# Patient Record
Sex: Male | Born: 1990 | Race: White | Hispanic: No | Marital: Married | Smoking: Former smoker
Health system: Southern US, Community
[De-identification: ages and names within clinical notes are randomized; demographics above are authoritative.]

## PROBLEM LIST (undated history)

## (undated) DIAGNOSIS — N200 Calculus of kidney: Secondary | ICD-10-CM

## (undated) HISTORY — PX: NO PAST SURGERIES: SHX2092

---
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
## 2014-09-23 ENCOUNTER — Emergency Department: Payer: Self-pay

## 2014-09-23 ENCOUNTER — Encounter: Payer: Self-pay | Admitting: *Deleted

## 2014-09-23 ENCOUNTER — Emergency Department
Admission: EM | Admit: 2014-09-23 | Discharge: 2014-09-23 | Disposition: A | Discharge status: Home / Self Care | Payer: Self-pay | Attending: Emergency Medicine | Admitting: Emergency Medicine

## 2014-09-23 DIAGNOSIS — M545 Low back pain: Secondary | ICD-10-CM | POA: Insufficient documentation

## 2014-09-23 DIAGNOSIS — Z720 Tobacco use: Secondary | ICD-10-CM | POA: Insufficient documentation

## 2014-09-23 DIAGNOSIS — R1031 Right lower quadrant pain: Secondary | ICD-10-CM | POA: Insufficient documentation

## 2014-09-23 LAB — URINALYSIS COMPLETE WITH MICROSCOPIC (ARMC ONLY)
Bacteria, UA: NONE SEEN
Bilirubin Urine: NEGATIVE
GLUCOSE, UA: NEGATIVE mg/dL
KETONES UR: NEGATIVE mg/dL
NITRITE: NEGATIVE
Protein, ur: NEGATIVE mg/dL
SPECIFIC GRAVITY, URINE: 1.019 (ref 1.005–1.030)
Squamous Epithelial / LPF: NONE SEEN
pH: 5 (ref 5.0–8.0)

## 2014-09-23 MED ORDER — IBUPROFEN 800 MG PO TABS: 800.0000 mg | ORAL_TABLET | Freq: Three times a day (TID) | ORAL | Status: DC | PRN

## 2014-09-23 MED ORDER — OXYCODONE-ACETAMINOPHEN 5-325 MG PO TABS
2.0000 | ORAL_TABLET | Freq: Once | ORAL | Status: AC
  Administered 2014-09-23: 2 via ORAL

## 2014-09-23 MED ORDER — CIPROFLOXACIN HCL 250 MG PO TABS: 250.0000 mg | ORAL_TABLET | Freq: Two times a day (BID) | ORAL | Status: AC

## 2014-09-23 MED ORDER — OXYCODONE-ACETAMINOPHEN 5-325 MG PO TABS
ORAL_TABLET | ORAL | Status: AC
  Filled 2014-09-23: qty 1

## 2014-09-23 NOTE — Discharge Instructions (Signed)

## 2014-09-23 NOTE — ED Notes (Signed)
Pt states after he urinated it did relieve some of his pain.

## 2014-09-23 NOTE — ED Notes (Signed)
Pt states he started to have lower right back pain this morning. Now the pain goes down through his groin/right testicle. He denies swelling. Denies urinary symptoms or penile discharge

## 2014-09-23 NOTE — ED Provider Notes (Signed)
[facility] Emergency Department Provider Note     Time seen: ----------------------------------------- 7:40 PM on 09/23/2014 -----------------------------------------    I have reviewed the triage vital signs and the nursing notes.   HISTORY  Chief Complaint Groin Pain    HPI Charles Dunlap is a 24 y.o. male presents ER for right lower back pain and right groin testicular pain. He denies any swelling, denies any urinary symptoms or discharge. Patient states he does light lifting on his job, was complaining of just mild pain.   History reviewed. No pertinent past medical history.  There are no active problems to display for this patient.   History reviewed. No pertinent past surgical history.  Allergies Review of patient's allergies indicates no known allergies.  Social History History  Substance Use Topics  . Smoking status: Current Every Day Smoker  . Smokeless tobacco: Not on file  . Alcohol Use: No    Review of Systems Constitutional: Negative for fever. Eyes: Negative for visual changes. ENT: Negative for sore throat. Cardiovascular: Negative for chest pain. Respiratory: Negative for shortness of breath. Gastrointestinal: Positive for right-sided groin pain. Genitourinary: Negative for dysuria. Musculoskeletal: Positive for right-sided lower back pain Skin: Negative for rash. Neurological: Negative for headaches, focal weakness or numbness.  10-point ROS otherwise negative.  ____________________________________________   PHYSICAL EXAM:  VITAL SIGNS: ED Triage Vitals  Enc Vitals Group     BP 09/23/14 1843 112/66 mmHg     Pulse Rate 09/23/14 1843 95     Resp 09/23/14 1843 16     Temp 09/23/14 1843 98.2 F (36.8 C)     Temp Source 09/23/14 1843 Oral     SpO2 09/23/14 1843 97 %     Weight 09/23/14 1843 139 lb (63.05 kg)     Height 09/23/14 1843  (1.575 m)     Head Cir --      Peak Flow --      Pain Score  09/23/14 1843 6     Pain Loc --      Pain Edu? --      Excl. in GC? --     Constitutional: Alert and oriented. Well appearing and in no distress. Eyes: Conjunctivae are normal. PERRL. Normal extraocular movements. ENT   Head: Normocephalic and atraumatic.   Nose: No congestion/rhinnorhea.   Mouth/Throat: Mucous membranes are moist.   Neck: No stridor. Cardiovascular: Normal rate, regular rhythm. Normal and symmetric distal pulses are present in all extremities. No murmurs, rubs, or gallops. Respiratory: Normal respiratory effort without tachypnea nor retractions. Breath sounds are clear and equal bilaterally. No wheezes/rales/rhonchi. Gastrointestinal: Soft and nontender. No distention. No abdominal bruits. No CVA tenderness Genitourinary: Patient has small inguinal bulge with straining or coughing system with mild hernia. Testicles are nontender. Scrotum and penile exam is unremarkable. Musculoskeletal: Nontender with normal range of motion in all extremities. No joint effusions.  No lower extremity tenderness nor edema. Neurologic:  Normal speech and language. No gross focal neurologic deficits are appreciated. Speech is normal. No gait instability. Skin:  Skin is warm, dry and intact. No rash noted. Psychiatric: Mood and affect are normal. Speech and behavior are normal. Patient exhibits appropriate insight and judgment. ____________________________________________  ED COURSE:  Pertinent labs & imaging results that were available during my care of the patient were reviewed by me and considered in my medical decision making (see chart for details). Patient looks well, minimal complaints currently. We'll look at his urine as well as get a  KUB ____________________________________________    LABS (pertinent positives/negatives)  Labs Reviewed  URINALYSIS COMPLETEWITH MICROSCOPIC ([facility] ONLY) - Abnormal; Notable for the following:    Color, Urine YELLOW (*)    APPearance  HAZY (*)    Hgb urine dipstick 2+ (*)    Leukocytes, UA TRACE (*)    All other components within normal limits    RADIOLOGY Images were viewed by me  KUB FINDINGS: Normal bowel gas pattern. No calcified urinary tract calculi are visible. Normal appearing bones.  IMPRESSION: Normal examination. ____________________________________________  FINAL ASSESSMENT AND PLAN  Groin pain, inguinal hernia  Plan: Patient with labs and imaging as dictated above. KUB is unremarkable. Unclear etiology for the urine appearance. We'll put him on a few days of Cipro and have him return in 2 days for recheck.   Emily Filbert, MD   Emily Filbert, MD 09/23/14 [phone]

## 2015-10-24 ENCOUNTER — Encounter: Payer: Self-pay | Admitting: Emergency Medicine

## 2015-10-24 ENCOUNTER — Emergency Department
Admission: EM | Admit: 2015-10-24 | Discharge: 2015-10-24 | Disposition: A | Discharge status: Home / Self Care | Payer: BC Managed Care – PPO | Attending: Emergency Medicine | Admitting: Emergency Medicine

## 2015-10-24 ENCOUNTER — Emergency Department: Payer: BC Managed Care – PPO

## 2015-10-24 DIAGNOSIS — N200 Calculus of kidney: Secondary | ICD-10-CM | POA: Insufficient documentation

## 2015-10-24 DIAGNOSIS — R1032 Left lower quadrant pain: Secondary | ICD-10-CM | POA: Diagnosis present

## 2015-10-24 DIAGNOSIS — R109 Unspecified abdominal pain: Secondary | ICD-10-CM

## 2015-10-24 DIAGNOSIS — F17200 Nicotine dependence, unspecified, uncomplicated: Secondary | ICD-10-CM | POA: Insufficient documentation

## 2015-10-24 LAB — COMPREHENSIVE METABOLIC PANEL
ALBUMIN: 4.2 g/dL (ref 3.5–5.0)
ALK PHOS: 98 U/L (ref 38–126)
ALT: 35 U/L (ref 17–63)
ANION GAP: 6 (ref 5–15)
AST: 31 U/L (ref 15–41)
BUN: 18 mg/dL (ref 6–20)
CALCIUM: 9.1 mg/dL (ref 8.9–10.3)
CO2: 27 mmol/L (ref 22–32)
Chloride: 106 mmol/L (ref 101–111)
Creatinine, Ser: 1.04 mg/dL (ref 0.61–1.24)
GFR calc Af Amer: >60 mL/min (ref >60)
GFR calc non Af Amer: >60 mL/min (ref >60)
GLUCOSE: 109 mg/dL — AB (ref 65–99)
Potassium: 4.1 mmol/L (ref 3.5–5.1)
SODIUM: 139 mmol/L (ref 135–145)
Total Bilirubin: 0.7 mg/dL (ref 0.3–1.2)
Total Protein: 7.6 g/dL (ref 6.5–8.1)

## 2015-10-24 LAB — CBC WITH DIFFERENTIAL/PLATELET
BASOS ABS: 0.1 10*3/uL (ref 0–0.1)
Eosinophils Absolute: 0.2 10*3/uL (ref 0–0.7)
Eosinophils Relative: 2 %
HCT: 43.2 % (ref 40.0–52.0)
HEMOGLOBIN: 15.5 g/dL (ref 13.0–18.0)
Lymphocytes Relative: 53 %
Lymphs Abs: 4.8 10*3/uL — ABNORMAL HIGH (ref 1.0–3.6)
MCH: 32.7 pg (ref 26.0–34.0)
MCHC: 35.9 g/dL (ref 32.0–36.0)
MCV: 91 fL (ref 80.0–100.0)
Monocytes Absolute: 0.7 10*3/uL (ref 0.2–1.0)
Neutro Abs: 3.3 10*3/uL (ref 1.4–6.5)
Platelets: 180 10*3/uL (ref 150–440)
RBC: 4.75 MIL/uL (ref 4.40–5.90)
RDW: 12.7 % (ref 11.5–14.5)
WBC: 9.2 10*3/uL (ref 3.8–10.6)

## 2015-10-24 LAB — URINALYSIS COMPLETE WITH MICROSCOPIC (ARMC ONLY)
Bacteria, UA: NONE SEEN
Bilirubin Urine: NEGATIVE
Glucose, UA: NEGATIVE mg/dL
Ketones, ur: NEGATIVE mg/dL
Leukocytes, UA: NEGATIVE
NITRITE: NEGATIVE
Protein, ur: 30 mg/dL — AB
SPECIFIC GRAVITY, URINE: 1.016 (ref 1.005–1.030)
pH: 6 (ref 5.0–8.0)

## 2015-10-24 LAB — LIPASE, BLOOD: Lipase: 30 U/L (ref 11–51)

## 2015-10-24 MED ORDER — TAMSULOSIN HCL 0.4 MG PO CAPS: 0.4000 mg | ORAL_CAPSULE | Freq: Every day | ORAL | 0 refills | Status: DC

## 2015-10-24 MED ORDER — ONDANSETRON HCL 4 MG/2ML IJ SOLN
4.0000 mg | Freq: Once | INTRAMUSCULAR | Status: AC
  Administered 2015-10-24: 4 mg via INTRAVENOUS
  Filled 2015-10-24: qty 2

## 2015-10-24 MED ORDER — MORPHINE SULFATE (PF) 4 MG/ML IV SOLN
4.0000 mg | Freq: Once | INTRAVENOUS | Status: AC
  Administered 2015-10-24: 4 mg via INTRAVENOUS
  Filled 2015-10-24: qty 1

## 2015-10-24 MED ORDER — ONDANSETRON HCL 4 MG PO TABS: 4.0000 mg | ORAL_TABLET | Freq: Three times a day (TID) | ORAL | 0 refills | Status: DC | PRN

## 2015-10-24 MED ORDER — OXYCODONE-ACETAMINOPHEN 5-325 MG PO TABS: 1.0000 | ORAL_TABLET | Freq: Four times a day (QID) | ORAL | 0 refills | Status: AC | PRN

## 2015-10-24 MED ORDER — SODIUM CHLORIDE 0.9 % IV BOLUS (SEPSIS)
1000.0000 mL | Freq: Once | INTRAVENOUS | Status: AC
  Administered 2015-10-24: 1000 mL via INTRAVENOUS

## 2015-10-24 NOTE — Discharge Instructions (Signed)
Please seek medical attention for any high fevers, chest pain, shortness of breath, change in behavior, persistent vomiting, bloody stool or any other new or concerning symptoms.  

## 2015-10-24 NOTE — ED Provider Notes (Signed)
[facility] Emergency Department Provider Note   ____________________________________________   I have reviewed the triage vital signs and the nursing notes.   HISTORY  Chief Complaint Abdominal Pain; Nausea; and Emesis   History limited by: Not Limited   HPI Charles Dunlap is a 25 y.o. male who presents to the emergency department today because concerns for left lower quadrant pain. The pain started this morning. It is severe. It is a cramping type pain. It has been associated with some nausea and vomiting. The pain will be somewhat relieved after the vomiting. The patient has not had any diarrhea this morning. He has not noticed any recent change in his bowel movements. No change in his stooling. The patient denies any fevers. No sick contacts. No unusual ingestion.   History reviewed. No pertinent past medical history.  There are no active problems to display for this patient.   History reviewed. No pertinent surgical history.  Prior to Admission medications   Medication Sig Start Date End Date Taking? Authorizing Provider  ibuprofen (ADVIL,MOTRIN) 800 MG tablet Take 1 tablet (800 mg total) by mouth every 8 (eight) hours as needed. 09/23/14   Emily FilbertJonathan E Williams, MD    Allergies Review of patient's allergies indicates no known allergies.  No family history on file.  Social History Social History  Substance Use Topics  . Smoking status: Current Every Day Smoker  . Smokeless tobacco: Not on file  . Alcohol use No    Review of Systems  Constitutional: Negative for fever. Cardiovascular: Negative for chest pain. Respiratory: Negative for shortness of breath. Gastrointestinal: Positive for left lower quadrant pain. Genitourinary: Negative for dysuria. Neurological: Negative for headaches, focal weakness or numbness.  10-point ROS otherwise negative.  ____________________________________________   PHYSICAL EXAM:  VITAL  SIGNS: ED Triage Vitals  Enc Vitals Group     BP 10/24/15 0835 125/64     Pulse Rate 10/24/15 0835 (!) 54     Resp 10/24/15 0835 18     Temp --      Temp Source 10/24/15 0835 Oral     SpO2 10/24/15 0835 100 %     Weight 10/24/15 0836 140 lb (63.5 kg)     Height 10/24/15 0836 5\' 10"  (1.778 m)     Head Circumference --      Peak Flow --      Pain Score 10/24/15 0836 8   Constitutional: Alert and oriented. Appears uncomfortable. Eyes: Conjunctivae are normal. Normal extraocular movements. ENT   Head: Normocephalic and atraumatic.   Nose: No congestion/rhinnorhea.   Mouth/Throat: Mucous membranes are moist.   Neck: No stridor. Hematological/Lymphatic/Immunilogical: No cervical lymphadenopathy. Cardiovascular: Normal rate, regular rhythm.  No murmurs, rubs, or gallops. Respiratory: Normal respiratory effort without tachypnea nor retractions. Breath sounds are clear and equal bilaterally. No wheezes/rales/rhonchi. Gastrointestinal: Soft and tender to palpation in the left lower quadrant. No rebound. No guarding. Genitourinary: Deferred Musculoskeletal: Normal range of motion in all extremities. No lower extremity edema. Neurologic:  Normal speech and language. No gross focal neurologic deficits are appreciated.  Skin:  Skin is warm, dry and intact. No rash noted. Psychiatric: Mood and affect are normal. Speech and behavior are normal. Patient exhibits appropriate insight and judgment.  ____________________________________________    LABS (pertinent positives/negatives)  Labs Reviewed  CBC WITH DIFFERENTIAL/PLATELET - Abnormal; Notable for the following:       Result Value   Lymphs Abs 4.8 (*)    All other components within normal limits  COMPREHENSIVE METABOLIC PANEL - Abnormal; Notable for the following:    Glucose, Bld 109 (*)    All other components within normal limits  URINALYSIS COMPLETEWITH MICROSCOPIC ([facility] ONLY) - Abnormal; Notable for the following:     Color, Urine YELLOW (*)    APPearance CLEAR (*)    Hgb urine dipstick 3+ (*)    Protein, ur 30 (*)    Squamous Epithelial / LPF 0-5 (*)    All other components within normal limits  LIPASE, BLOOD  '   ____________________________________________   EKG  None  ____________________________________________    RADIOLOGY  CT stone   IMPRESSION:  1-2 mm diameter calculus at LEFT ureterovesical junction without  hydronephrosis or hydroureter.      ___________________________________________   PROCEDURES  Procedures  ____________________________________________   INITIAL IMPRESSION / ASSESSMENT AND PLAN / ED COURSE  Pertinent labs & imaging results that were available during my care of the patient were reviewed by me and considered in my medical decision making (see chart for details).  Patient presents to the emergency department today because of concerns for left lower quadrant cramping, pain nausea and vomiting. On exam patient does appear uncomfortable and does have some mild tenderness in the left lower quadrant. Will check blood work and urine.  Clinical Course   Urine did show multiple red blood cells. CT scan did confirm a left UVJ stone. 1-2 mm. This should be able to pass on its own. Discussed this with the patient. Discussed infection return precautions. ____________________________________________   FINAL CLINICAL IMPRESSION(S) / ED DIAGNOSES  Final diagnoses:  Left flank pain  Kidney stone     Note: This dictation was prepared with Dragon dictation. Any transcriptional errors that result from this process are unintentional    Phineas Semen, MD 10/24/15 1324

## 2015-10-24 NOTE — ED Triage Notes (Signed)
Pt presents with LLQ abdominal pain with nausea and vomiting that started today.

## 2017-08-17 ENCOUNTER — Emergency Department
Admission: EM | Admit: 2017-08-17 | Discharge: 2017-08-17 | Disposition: A | Discharge status: Home / Self Care | Payer: BC Managed Care – PPO | Attending: Emergency Medicine | Admitting: Emergency Medicine

## 2017-08-17 ENCOUNTER — Encounter: Payer: Self-pay | Admitting: Emergency Medicine

## 2017-08-17 ENCOUNTER — Other Ambulatory Visit: Payer: Self-pay

## 2017-08-17 DIAGNOSIS — F17200 Nicotine dependence, unspecified, uncomplicated: Secondary | ICD-10-CM | POA: Insufficient documentation

## 2017-08-17 DIAGNOSIS — R070 Pain in throat: Secondary | ICD-10-CM | POA: Diagnosis present

## 2017-08-17 DIAGNOSIS — J029 Acute pharyngitis, unspecified: Secondary | ICD-10-CM | POA: Insufficient documentation

## 2017-08-17 DIAGNOSIS — Z79899 Other long term (current) drug therapy: Secondary | ICD-10-CM | POA: Insufficient documentation

## 2017-08-17 LAB — GROUP A STREP BY PCR: GROUP A STREP BY PCR: NOT DETECTED

## 2017-08-17 MED ORDER — ONDANSETRON 4 MG PO TBDP: 4.0000 mg | ORAL_TABLET | Freq: Three times a day (TID) | ORAL | 0 refills | Status: DC | PRN

## 2017-08-17 MED ORDER — KETOROLAC TROMETHAMINE 30 MG/ML IJ SOLN
15.0000 mg | INTRAMUSCULAR | Status: AC
  Administered 2017-08-17: 15 mg via INTRAMUSCULAR
  Filled 2017-08-17: qty 1

## 2017-08-17 MED ORDER — IBUPROFEN 200 MG PO TABS: 600.0000 mg | ORAL_TABLET | Freq: Four times a day (QID) | ORAL | 0 refills | Status: DC | PRN

## 2017-08-17 MED ORDER — DEXAMETHASONE SODIUM PHOSPHATE 10 MG/ML IJ SOLN
10.0000 mg | Freq: Once | INTRAMUSCULAR | Status: AC
  Administered 2017-08-17: 10 mg via INTRAMUSCULAR
  Filled 2017-08-17: qty 1

## 2017-08-17 NOTE — Discharge Instructions (Addendum)
Your strep test was negative. Drink lots of water to stay hydrated.  Hot water or herbal tea with honey can help soothe your throat.

## 2017-08-17 NOTE — ED Triage Notes (Addendum)
Patient ambulatory to triage with steady gait, without difficulty, voice muffled; Sore throat with fever yesterday; awoke PTA and felt increased pain/swelling to left side throat

## 2017-08-17 NOTE — ED Provider Notes (Signed)
[facility] Emergency Department Provider Note  ____________________________________________  Time seen: Approximately 5:13 AM  I have reviewed the triage vital signs and the nursing notes.   HISTORY  Chief Complaint Sore Throat    HPI Charles Dunlap is a 27 y.o. male with no past medical history who complains of sore throat starting 1 day ago.  His daughter recently was sick with hand-foot-and-mouth disease.  He had a subjective fever, sore throat, and then tonight he woke up with increased pain and swelling on the left side of the throat.  It hurts to swallow.  He is not having difficulty with his saliva.  No vomiting.  Denies chest pain shortness of breath or abdominal pain.  He does have some mild nausea as well.  Pain is constant, worse with swallowing no alleviating factors.  Moderate intensity.  Burning sensation.    History reviewed. No pertinent past medical history.   There are no active problems to display for this patient.    History reviewed. No pertinent surgical history.   Prior to Admission medications   Medication Sig Start Date End Date Taking? Authorizing Provider  ibuprofen (MOTRIN IB) 200 MG tablet Take 3 tablets (600 mg total) by mouth every 6 (six) hours as needed. 08/17/17   Sharman CheekStafford, Jonluke Cobbins, MD  ondansetron (ZOFRAN ODT) 4 MG disintegrating tablet Take 1 tablet (4 mg total) by mouth every 8 (eight) hours as needed for nausea or vomiting. 08/17/17   Sharman CheekStafford, Sesilia Poucher, MD  ondansetron (ZOFRAN) 4 MG tablet Take 1 tablet (4 mg total) by mouth every 8 (eight) hours as needed for nausea or vomiting. 10/24/15   Phineas SemenGoodman, Graydon, MD  tamsulosin (FLOMAX) 0.4 MG CAPS capsule Take 1 capsule (0.4 mg total) by mouth daily. 10/24/15   Phineas SemenGoodman, Graydon, MD     Allergies Patient has no known allergies.   No family history on file.  Social History Social History   Tobacco Use  . Smoking status: Current Every Day Smoker  Substance Use  Topics  . Alcohol use: No  . Drug use: No    Review of Systems  Constitutional:   Positive fever at home ENT:   Positive sore throat. No rhinorrhea. Cardiovascular:   No chest pain or syncope. Respiratory:   No dyspnea or cough. Gastrointestinal:   Negative for abdominal pain, vomiting and diarrhea.  Musculoskeletal:   Negative for focal pain or swelling All other systems reviewed and are negative except as documented above in ROS and HPI.  ____________________________________________   PHYSICAL EXAM:  VITAL SIGNS: ED Triage Vitals [08/17/17 0359]  Enc Vitals Group     BP 126/66     Pulse Rate 88     Resp 20     Temp 99 F (37.2 C)     Temp Source Oral     SpO2      Weight 160 lb (72.6 kg)     Height 5\' 10"  (1.778 m)     Head Circumference      Peak Flow      Pain Score 6     Pain Loc      Pain Edu?      Excl. in GC?     Vital signs reviewed, nursing assessments reviewed.   Constitutional:   Alert and oriented. Non-toxic appearance. Eyes:   Conjunctivae are normal. EOMI. PERRL. ENT      Head:   Normocephalic and atraumatic.      Nose:   No congestion/rhinnorhea.  Mouth/Throat:   MMM, bilateral pharyngeal erythema without exudate. No peritonsillar mass.       Neck:   No meningismus. Full ROM. Hematological/Lymphatic/Immunilogical:   Left submandibular lymphadenopathy. Cardiovascular:   RRR. Symmetric bilateral radial and DP pulses.  No murmurs.  Respiratory:   Normal respiratory effort without tachypnea/retractions. Breath sounds are clear and equal bilaterally. No wheezes/rales/rhonchi. Gastrointestinal:   Soft and nontender. Non distended. There is no CVA tenderness.  No rebound, rigidity, or guarding.  Musculoskeletal:   Normal range of motion in all extremities. No joint effusions.  No lower extremity tenderness.  No edema. Neurologic:   Normal speech and language.  Motor grossly intact. No acute focal neurologic deficits are appreciated.  Skin:     Skin is warm, dry and intact. No rash noted.  No petechiae, purpura, or bullae.  ____________________________________________    LABS (pertinent positives/negatives) (all labs ordered are listed, but only abnormal results are displayed) Labs Reviewed  GROUP A STREP BY PCR   ____________________________________________   EKG    ____________________________________________    RADIOLOGY  No results found.  ____________________________________________   PROCEDURES Procedures  ____________________________________________    CLINICAL IMPRESSION / ASSESSMENT AND PLAN / ED COURSE  Pertinent labs & imaging results that were available during my care of the patient were reviewed by me and considered in my medical decision making (see chart for details).    Patient presents with pharyngitis and tonsillitis.  Strep test is negative.  Most likely due to coxsackievirus.  I highly doubt PTA or RPA.  No airway compromise.  Managing secretions.  Treat symptomatically with Decadron and Toradol here, continue NSAIDs and Zofran as needed at home.  Follow-up with primary care..      ____________________________________________   FINAL CLINICAL IMPRESSION(S) / ED DIAGNOSES    Final diagnoses:  Viral pharyngitis     ED Discharge Orders        Ordered    ibuprofen (MOTRIN IB) 200 MG tablet  Every 6 hours PRN     08/17/17 0511    ondansetron (ZOFRAN ODT) 4 MG disintegrating tablet  Every 8 hours PRN     08/17/17 0511      Portions of this note were generated with dragon dictation software. Dictation errors may occur despite best attempts at proofreading.    Sharman Cheek, MD 08/17/17 [phone]

## 2018-04-26 ENCOUNTER — Other Ambulatory Visit: Payer: Self-pay

## 2018-04-26 ENCOUNTER — Emergency Department
Admission: EM | Admit: 2018-04-26 | Discharge: 2018-04-26 | Disposition: A | Discharge status: Home / Self Care | Payer: Worker's Compensation | Attending: Student in an Organized Health Care Education/Training Program | Admitting: Student in an Organized Health Care Education/Training Program

## 2018-04-26 DIAGNOSIS — F17200 Nicotine dependence, unspecified, uncomplicated: Secondary | ICD-10-CM | POA: Diagnosis not present

## 2018-04-26 DIAGNOSIS — Y9289 Other specified places as the place of occurrence of the external cause: Secondary | ICD-10-CM | POA: Insufficient documentation

## 2018-04-26 DIAGNOSIS — S61411A Laceration without foreign body of right hand, initial encounter: Secondary | ICD-10-CM | POA: Diagnosis not present

## 2018-04-26 DIAGNOSIS — Y9389 Activity, other specified: Secondary | ICD-10-CM | POA: Diagnosis not present

## 2018-04-26 DIAGNOSIS — S6991XA Unspecified injury of right wrist, hand and finger(s), initial encounter: Secondary | ICD-10-CM | POA: Diagnosis present

## 2018-04-26 DIAGNOSIS — X58XXXA Exposure to other specified factors, initial encounter: Secondary | ICD-10-CM | POA: Diagnosis not present

## 2018-04-26 DIAGNOSIS — Y990 Civilian activity done for income or pay: Secondary | ICD-10-CM | POA: Diagnosis not present

## 2018-04-26 MED ORDER — LIDOCAINE HCL (PF) 1 % IJ SOLN
5.0000 mL | Freq: Once | INTRAMUSCULAR | Status: AC
  Administered 2018-04-26: 5 mL
  Filled 2018-04-26: qty 5

## 2018-04-26 NOTE — ED Triage Notes (Signed)
Pt has laceration to rt hand from work.

## 2018-04-26 NOTE — ED Provider Notes (Signed)
[facility] Emergency Department Provider Note ____________________________________________  Time seen: 1600  I have reviewed the triage vital signs and the nursing notes.  HISTORY  Chief Complaint  Laceration  HPI Charles Dunlap is a 28 y.o. Right-handed male presents himself to the ED for evaluation of an accidental laceration to the right hand.  Patient works at a [employer], and describes repairing a tire.  He was placing air in the tire the large tire exploded in his hand.  It caused a hyper extension injury of his thumb at the first interspace, leaving the patient with a large "laceration" to the base of the thumb.  He presents now with an open wound exposing the flexor tendon.  He denies any other injury at this time.  He reports his tetanus status is current up to date.  No past medical history on file.  There are no active problems to display for this patient.  No past surgical history on file.  Prior to Admission medications   Not on File    Allergies Patient has no known allergies.  No family history on file.  Social History Social History   Tobacco Use  . Smoking status: Current Every Day Smoker  Substance Use Topics  . Alcohol use: No  . Drug use: No    Review of Systems  Constitutional: Negative for fever. Cardiovascular: Negative for chest pain. Respiratory: Negative for shortness of breath. Musculoskeletal: Negative for back pain. Skin: Negative for rash. Hand laceration as above Neurological: Negative for headaches, focal weakness or numbness. ____________________________________________  PHYSICAL EXAM:  VITAL SIGNS: ED Triage Vitals  Enc Vitals Group     BP 04/26/18 1552 122/70     Pulse Rate 04/26/18 1552 72     Resp 04/26/18 1552 16     Temp 04/26/18 1552 (!) 97.5 F (36.4 C)     Temp Source 04/26/18 1552 Oral     SpO2 04/26/18 1552 99 %     Weight 04/26/18 1549 158 lb 11.7 oz (72 kg)   Height --      Head Circumference --      Peak Flow --      Pain Score 04/26/18 1549 8     Pain Loc --      Pain Edu? --      Excl. in GC? --     Constitutional: Alert and oriented. Well appearing and in no distress. Head: Normocephalic and atraumatic. Eyes: Conjunctivae are normal. Normal extraocular movements Cardiovascular: Normal rate, regular rhythm. Normal distal pulses. Respiratory: Normal respiratory effort.  Musculoskeletal: Right hand without any obvious deformity or dislocation.  Patient able to demonstrate normal composite fist on the right.  Normal thumb flexion range noted.  Patient with a laceration extending along the palmar base of the right thumb.  Nontender with normal range of motion in all extremities.  Neurologic:  Normal gross sensation. Normal intrinsic and opposition testing noted. No gross focal neurologic deficits are appreciated. Skin:  Skin is warm, dry and intact. No rash noted. ____________________________________________  PROCEDURES  .Marland KitchenLaceration Repair Date/Time: 04/26/2018 5:40 PM Performed by: Lissa Hoard, PA-C Authorized by: Lissa Hoard, PA-C   Consent:    Consent given by:  Patient   Risks discussed:  Pain, infection and poor wound healing Anesthesia (see MAR for exact dosages):    Anesthesia method:  Local infiltration   Local anesthetic:  Lidocaine 1% w/o epi Laceration details:    Location:  Hand  Length (cm):  4   Depth (mm):  4 Repair type:    Repair type:  Simple Pre-procedure details:    Preparation:  Patient was prepped and draped in usual sterile fashion Exploration:    Contaminated: no   Treatment:    Area cleansed with:  Betadine and saline   Amount of cleaning:  Standard   Irrigation solution:  Sterile saline   Irrigation method:  Syringe   Visualized foreign bodies/material removed: no   Skin repair:    Repair method:  Sutures   Suture size:  4-0   Suture material:  Nylon   Suture technique:   Simple interrupted   Number of sutures:  9 Approximation:    Approximation:  Close Post-procedure details:    Dressing:  Non-adherent dressing and bulky dressing   Patient tolerance of procedure:  Tolerated well, no immediate complications  ____________________________________________  INITIAL IMPRESSION / ASSESSMENT AND PLAN / ED COURSE  Patient with ED evaluation of an accidental laceration to the right hand.  The palmar wound is repaired using sutures with good wound edge approximation.  The patient is discharged with wound care instructions and supplies.  He will follow-up with [place] urgent care for suture removal and interim wound check in 12 to 14 days.  He is return to work with right hand use as tolerated, with instructions to keep the wound/dressing clean and dry. ____________________________________________  FINAL CLINICAL IMPRESSION(S) / ED DIAGNOSES  Final diagnoses:  Laceration of right hand without foreign body, initial encounter      Lissa Hoard, PA-C 04/26/18 2322    Willy Eddy, MD 04/26/18 2351

## 2018-04-26 NOTE — Discharge Instructions (Signed)
Keep the wound clean, dry, and covered. Use only soap & water to cleanse the wound. Follow-up with [facility] for suture removal in 12 days, or interim wound checks.

## 2018-04-26 NOTE — ED Notes (Signed)
See triage note.  States he was working with a truck tire and it exploded   Laceration to right hand   Was seen at urgent care and sent here d/t depth of laceration

## 2018-11-20 ENCOUNTER — Other Ambulatory Visit: Payer: Self-pay

## 2018-11-20 ENCOUNTER — Ambulatory Visit
Admission: EM | Admit: 2018-11-20 | Discharge: 2018-11-20 | Disposition: A | Discharge status: Home / Self Care | Payer: BC Managed Care – PPO | Attending: Emergency Medicine | Admitting: Emergency Medicine

## 2018-11-20 ENCOUNTER — Ambulatory Visit (INDEPENDENT_AMBULATORY_CARE_PROVIDER_SITE_OTHER)
Admit: 2018-11-20 | Discharge: 2018-11-20 | Disposition: A | Discharge status: Home / Self Care | Payer: BC Managed Care – PPO | Attending: Emergency Medicine | Admitting: Emergency Medicine

## 2018-11-20 ENCOUNTER — Ambulatory Visit (INDEPENDENT_AMBULATORY_CARE_PROVIDER_SITE_OTHER): Payer: BC Managed Care – PPO

## 2018-11-20 DIAGNOSIS — L03115 Cellulitis of right lower limb: Secondary | ICD-10-CM

## 2018-11-20 DIAGNOSIS — M25561 Pain in right knee: Secondary | ICD-10-CM

## 2018-11-20 MED ORDER — DOXYCYCLINE HYCLATE 100 MG PO CAPS: 100.0000 mg | ORAL_CAPSULE | Freq: Two times a day (BID) | ORAL | 0 refills | Status: DC

## 2018-11-20 MED ORDER — CEFTRIAXONE SODIUM 1 G IJ SOLR
1.0000 g | Freq: Once | INTRAMUSCULAR | Status: AC
  Administered 2018-11-20: 17:00:00 1 g via INTRAMUSCULAR

## 2018-11-20 NOTE — ED Triage Notes (Signed)
Patient states that 1.5 week ago he started noticing an abscess on his right knee. Patient states that the area opened and drained and had improved. Patient states that he noticed this past Friday his knee was sore again, he has redness and swelling from knee down to his ankle. Warm to the touch.

## 2018-11-20 NOTE — Discharge Instructions (Signed)
Limit your standing and sitting.  Elevate your leg above the level of your heart sufficiently to control swelling and discomfort.  Apply warm compresses or heating pad to the lower leg 3 times daily for 10 to 15 minutes at a time.  Do not sleep with a heating pad.  Take medications as prescribed.  If you run a fever or are not improving go to the emergency room or return to our clinic.  Return to our clinic in 2 days for a recheck.

## 2018-11-20 NOTE — ED Provider Notes (Signed)
[facility]    CSN: [id number] Arrival date & time: 11/20/18  1403      History   Chief Complaint Chief Complaint  Patient presents with   Abscess    HPI Charles Dunlap is a 28 y.o. male.   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  He has states that the area opened spontaneously and drained purulent material and he assisted by gently squeezing to express as much as he could.  Afterwards his knee improved and he had no further problems.  He has had no fever or chills.  3 days prior to this visit his knee became sore again.  He has redness and swelling distal to the original site of drainage.  He states that his leg  was so swollen this morning he was unable to fit his jeans on.  He has taken car trip to [place] last week entailing a 2-1/2-hour drive there and back.  He states now the initial area of drainage is not tender or painful at all. It has not been draining.  Also his problem is the swelling of his leg and the tenderness and warmth that he is experiencing of his lower extremity.  Vital signs today show a temperature of 98.5 pulse rate of 104 blood pressure 137/79.       History reviewed. No pertinent past medical history.  There are no active problems to display for this patient.   Past Surgical History:  Procedure Laterality Date   NO PAST SURGERIES         Home Medications    Prior to Admission medications   Medication Sig Start Date End Date Taking? Authorizing Provider  doxycycline (VIBRAMYCIN) 100 MG capsule Take 1 capsule (100 mg total) by mouth 2 (two) times daily. 11/20/18   Lorin Picket, PA-C    Family History Family History  Problem Relation Age of Onset   Heart attack Mother    Diabetes Father     Social History Social History   Tobacco Use   Smoking status: Former Smoker    Quit date: 11/19/2016      Years since quitting: 2.0   Smokeless tobacco: Never Used  Substance Use Topics   Alcohol use: Yes    Comment: rare   Drug use: No     Allergies   Patient has no known allergies.   Review of Systems Review of Systems  Constitutional: Positive for activity change. Negative for appetite change, chills, diaphoresis, fatigue and fever.  Musculoskeletal: Positive for myalgias.  Skin: Positive for color change and wound.  All other systems reviewed and are negative.    Physical Exam Triage Vital Signs ED Triage Vitals  Enc Vitals Group     BP 11/20/18 1423 137/79     Pulse Rate 11/20/18 1423 (!) 104     Resp 11/20/18 1423 18     Temp 11/20/18 1423 98.5 F (36.9 C)     Temp Source 11/20/18 1423 Oral     SpO2 11/20/18 1423 100 %     Weight 11/20/18 1421 160 lb (72.6 kg)     Height 11/20/18 1421 6' (1.829 m)     Head Circumference --      Peak Flow --      Pain Score 11/20/18 1421 4     Pain Loc --      Pain  Edu? --      Excl. in GC? --    No data found.  Updated Vital Signs BP 117/78 (BP Location: Left Arm)    Pulse 76    Temp 98.2 F (36.8 C) (Oral)    Resp 16    Ht 6' (1.829 m)    Wt 160 lb (72.6 kg)    SpO2 100%    BMI 21.70 kg/m   Visual Acuity Right Eye Distance:   Left Eye Distance:   Bilateral Distance:    Right Eye Near:   Left Eye Near:    Bilateral Near:     Physical Exam Vitals signs and nursing note reviewed.  Constitutional:      General: He is not in acute distress.    Appearance: Normal appearance. He is normal weight. He is not ill-appearing, toxic-appearing or diaphoretic.  HENT:     Head: Normocephalic and atraumatic.  Eyes:     Conjunctiva/sclera: Conjunctivae normal.     Pupils: Pupils are equal, round, and reactive to light.  Neck:     Musculoskeletal: Normal range of motion and neck supple.  Cardiovascular:     Rate and Rhythm: Normal rate and regular rhythm.     Pulses: Normal pulses.     Heart sounds: Normal heart sounds.   Pulmonary:     Effort: Pulmonary effort is normal.     Breath sounds: Normal breath sounds.  Musculoskeletal: Normal range of motion.        General: Swelling and tenderness present.     Right lower leg: Edema present.     Comments: Examination of the right lower extremity shows swelling from the superior patella region inferiorly to the ankle.  The area is warm.  It is tender and circumferentially about the lower extremity.  Circumference of the right lower extremity is 38.5 cm in comparison to the left which is 35.5 cm.  Pulses are palpable.  Motion shows full extension and flexion to 100 degrees limited by swelling in the knee.  Does not appear to be any effusion present.  He has no tenderness about the knee specifically the patella or tibia.  He has a healing wound with's no fluctuation or induration over the antero-medial aspect of the proximal tibia.  There is no drainage present.  Skin:    General: Skin is warm and dry.     Findings: Erythema present.  Neurological:     General: No focal deficit present.     Mental Status: He is alert and oriented to person, place, and time.  Psychiatric:        Mood and Affect: Mood normal.        Behavior: Behavior normal.        Thought Content: Thought content normal.        Judgment: Judgment normal.      UC Treatments / Results  Labs (all labs ordered are listed, but only abnormal results are displayed) Labs Reviewed - No data to display  EKG   Radiology US Venous Img Lower Unilateral Right  Result Date: 11/20/2018 CLINICAL DATA:  Right lower extremity swelling and tenderness EXAM: RIGHT LOWER EXTREMITY VENOUS DOPPLER ULTRASOUND TECHNIQUE: Gray-scale sonography with graded compression, as well as color Doppler and duplex ultrasound were performed to evaluate the lower extremity deep venous systems from the level of the common femoral vein and including the common femoral, femoral, profunda femoral, popliteal and calf veins including  the posterior tibial, peroneal and gastrocnemius veins when visible.  The superficial great saphenous vein was also interrogated. Spectral Doppler was utilized to evaluate flow at rest and with distal augmentation maneuvers in the common femoral, femoral and popliteal veins. COMPARISON:  None. FINDINGS: Contralateral Common Femoral Vein: Respiratory phasicity is normal and symmetric with the symptomatic side. No evidence of thrombus. Normal compressibility. Common Femoral Vein: No evidence of thrombus. Normal compressibility, respiratory phasicity and response to augmentation. Saphenofemoral Junction: No evidence of thrombus. Normal compressibility and flow on color Doppler imaging. Profunda Femoral Vein: No evidence of thrombus. Normal compressibility and flow on color Doppler imaging. Femoral Vein: No evidence of thrombus. Normal compressibility, respiratory phasicity and response to augmentation. Popliteal Vein: No evidence of thrombus. Normal compressibility, respiratory phasicity and response to augmentation. Calf Veins: No evidence of thrombus. Normal compressibility and flow on color Doppler imaging. Superficial Great Saphenous Vein: No evidence of thrombus. Normal compressibility. Venous Reflux:  Not assessed Other Findings: Infrapatellar soft tissue swelling noted anteriorly. No underlying fluid collection. IMPRESSION: Negative for DVT in the right lower extremity. Right infrapatellar region soft tissue swelling. Electronically Signed   By: Judie Petit.  Shick M.D.   On: 11/20/2018 17:02   Dg Knee Complete 4 Views Right  Result Date: 11/20/2018 CLINICAL DATA:  Rule out abscess or bone infection. Soreness over the apex of the patella. EXAM: RIGHT KNEE - COMPLETE 4+ VIEW COMPARISON:  None. FINDINGS: The patient has a bipartite patella. No acute fracture or effusion. Anterior soft tissue swelling is seen, particularly anterior to the tibial tuberosity. No soft tissue gas or foreign body identified. No acute fractures  or dislocations. No other acute abnormalities. IMPRESSION: Anterior soft tissue swelling, particularly anterior to the tibial tuberosity. No soft tissue gas or foreign body identified. No bony erosion to suggest osteomyelitis. Bipartite patella. Electronically Signed   By: Gerome Sam III M.D   On: 11/20/2018 17:17    Procedures Procedures (including critical care time)  Medications Ordered in UC Medications  cefTRIAXone (ROCEPHIN) injection 1 g (1 g Intramuscular Given 11/20/18 1700)    Initial Impression / Assessment and Plan / UC Course  I have reviewed the triage vital signs and the nursing notes.  Pertinent labs & imaging results that were available during my care of the patient were reviewed by me and considered in my medical decision making (see chart for details).   28 year old male presents with a week and a half history of an abscess that drained from his right knee over the antero-proximal medial aspect.  He says after the drainage did seem to improve until this last Friday 3 days prior to this visit when his knee became sore swollen and warm.  Area of original drainage is not tender or draining any longer but the inferior portion of his lower extremity is erythematous warm tender and swollen.  Ultrasound was obtained because the possibility of a DVT which was found to be negative.  An x-ray was also obtained afterwards which revealed anterior soft tissue swelling anterior to the tibial tuberosity.  There is no soft tissue gas or foreign body identified.  There is no bony erosion to suggest osteomyelitis.  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  He was found to have a cellulitis of the lower extremity.  He has no fever or chills.  Does not complain of other systemic symptoms.  He received 1 g of Rocephin intramuscularly today.  I will place him on doxycycline 100 mg twice daily for the next 7 days.  I told him it is imperative that  he return to our clinic in 2 days to make sure that he  cellulitis is improving.  It is not he will likely require IV antibiotic therapy.  He will remain out of work for the next 3 days and limit his standing and sitting.  He was encouraged to elevate his leg above the level of his heart sufficiently control the swelling and pain.  He may also wrap his leg in warm compress or heating pad 3 times daily for a 10 to 15 minutes at a time Final Clinical Impressions(s) / UC Diagnoses   Final diagnoses:  Cellulitis of right lower extremity     Discharge Instructions     Limit your standing and sitting.  Elevate your leg above the level of your heart sufficiently to control swelling and discomfort.  Apply warm compresses or heating pad to the lower leg 3 times daily for 10 to 15 minutes at a time.  Do not sleep with a heating pad.  Take medications as prescribed.  If you run a fever or are not improving go to the emergency room or return to our clinic.  Return to our clinic in 2 days for a recheck.    ED Prescriptions    Medication Sig Dispense Auth. Provider   doxycycline (VIBRAMYCIN) 100 MG capsule Take 1 capsule (100 mg total) by mouth 2 (two) times daily. 14 capsule Lutricia Feiloemer, Annise Boran P, PA-C     PDMP not reviewed this encounter.   Lutricia FeilRoemer, Jerrel Tiberio P, PA-C 11/20/18 1745

## 2018-11-22 ENCOUNTER — Other Ambulatory Visit: Payer: Self-pay

## 2018-11-22 ENCOUNTER — Encounter: Payer: Self-pay | Admitting: Emergency Medicine

## 2018-11-22 ENCOUNTER — Ambulatory Visit
Admission: EM | Admit: 2018-11-22 | Discharge: 2018-11-22 | Disposition: A | Discharge status: Home / Self Care | Payer: BC Managed Care – PPO

## 2018-11-22 DIAGNOSIS — Z5189 Encounter for other specified aftercare: Secondary | ICD-10-CM

## 2018-11-22 DIAGNOSIS — L03115 Cellulitis of right lower limb: Secondary | ICD-10-CM

## 2018-11-22 NOTE — ED Triage Notes (Signed)
Pt states that he was told to follow up of cellulitis. He states it is much better and he can walk on it. Tolerating treatment well.

## 2018-11-22 NOTE — Discharge Instructions (Signed)
It was very nice seeing you today in clinic. Thank you for entrusting me with your care.   Continue antibiotics as prescribed. Ice and elevate knee to help with any swelling. May take Tylenol and/or ibuprofen as needed for pain if you have any. May return to work.   Make arrangements to follow up with your regular doctor in 1 week for re-evaluation if not improving. If your symptoms/condition worsens, please seek follow up care either here or in the ER. Please remember, our [place] providers are "right here with you" when you need [place].   Again, it was my pleasure to take care of you today. Thank you for choosing our clinic. I hope that you start to feel better quickly.   Honor Loh, MSN, APRN, FNP-C, CEN Advanced Practice Provider [facility]

## 2018-11-22 NOTE — ED Provider Notes (Signed)
[place], [place]   Name: Charles Dunlap DOB: 05-06-1990 MRN: [id number] CSN: [id number] PCP: System, Pcp Not In  Arrival date and time:  11/22/18 1102  Chief Complaint:  Cellulitis follow up  NOTE: Prior to seeing the patient today, I have reviewed the triage nursing documentation and vital signs. Clinical staff has updated patient's PMH/PSHx, current medication list, and drug allergies/intolerances to ensure comprehensive history available to assist in medical decision making.   History:   HPI: Charles Dunlap is a 28 y.o. male who presents today for a reassessment of a cellulitis that was diagnosed in his RIGHT lower extremity on 11/20/2018. Patient underwent ultrasound imaging that was negative for DVT. He was given IM ceftriaxone in clinic and was discharged home on a 7 day course of oral doxycycline. Patient was advised to return to clinic in 2 days for reassessment. Patient presents today with significant improvement in his symptoms. He states, "it looks and feels 10 times better that it did 2 days ago when I was here". Patient denies any drainage from the area. Erythema has completely resolved. Patient denies any pain in his calf; no claudication pain. Patient has not experienced any fevers. He has been following the recommendations made by previous provider; rest and elevation. Patient presents today feeling generally well. He states, "I am ready to go back to work I think".   History reviewed. No pertinent past medical history.  Past Surgical History:  Procedure Laterality Date   NO PAST SURGERIES      Family History  Problem Relation Age of Onset   Heart attack Mother    Diabetes Father     Social History   Tobacco Use   Smoking status: Former Smoker    Quit date: 11/19/2016    Years since quitting: 2.0   Smokeless tobacco: Never Used  Substance Use Topics   Alcohol use: Yes    Comment: rare   Drug use: No    There are no active problems to display  for this patient.   Home Medications:    Current Meds  Medication Sig   doxycycline (VIBRAMYCIN) 100 MG capsule Take 1 capsule (100 mg total) by mouth 2 (two) times daily.    Allergies:   Patient has no known allergies.  Review of Systems (ROS): Review of Systems   Vital Signs: Today's Vitals   11/22/18 1125 11/22/18 1127 11/22/18 1154  BP:  119/70   Pulse:  67   Resp:  16   Temp:  98.2 F (36.8 C)   TempSrc:  Oral   SpO2:  100%   Weight: 160 lb 0.9 oz (72.6 kg)    PainSc: 1   1     Physical Exam: Physical Exam  Urgent Care Treatments / Results:   LABS: PLEASE NOTE: all labs that were ordered this encounter are listed, however only abnormal results are displayed. Labs Reviewed - No data to display  EKG: -None  RADIOLOGY: No results found.  PROCEDURES: Procedures  MEDICATIONS RECEIVED THIS VISIT: Medications - No data to display  PERTINENT CLINICAL COURSE NOTES/UPDATES:   Initial Impression / Assessment and Plan / Urgent Care Course:  Pertinent labs & imaging results that were available during my care of the patient were personally reviewed by me and considered in my medical decision making (see lab/imaging section of note for values and interpretations).  Charles Dunlap is a 28 y.o. male who presents to [facility] today for follow up on recent cellulitis that he had diagnosed  in his RIGHT lower extremity on 11/20/2018.  Patient is well appearing overall in clinic today. He does not appear to be in any acute distress. Presenting symptoms (see HPI) and exam as documented above. Symptoms have significant improved with the IM and oral antibiotics thus far. Erythema and swelling have resolved. Patient notes pain rated 1/10 with ambulation only. He feels generally well; denies fever and drainage. Patient to continue oral doxycycline course. He was advised to also continue rest, ice, and elevation of his RLE and to return call to the clinic with any  questions or concerns. Patient may return to work at this point.   Discussed follow up with primary care physician in 1 week for re-evaluation. I have reviewed the follow up and strict return precautions for any new or worsening symptoms. Patient is aware of symptoms that would be deemed urgent/emergent, and would thus require further evaluation either here or in the emergency department. At the time of discharge, he verbalized understanding and consent with the discharge plan as it was reviewed with him. All questions were fielded by provider and/or clinic staff prior to patient discharge.    Final Clinical Impressions / Urgent Care Diagnoses:   Final diagnoses:  Encounter for wound re-check  Cellulitis of right lower extremity    New Prescriptions:  [place] Controlled Substance Registry consulted? Not Applicable  No orders of the defined types were placed in this encounter.   Recommended Follow up Care:  Patient encouraged to follow up with the following provider within the specified time frame, or sooner as dictated by the severity of his symptoms. As always, he was instructed that for any urgent/emergent care needs, he should seek care either here or in the emergency department for more immediate evaluation.  Follow-up Information    PCP In 1 week.   Why: General reassessment of symptoms if not improving        NOTE: This note was prepared using Lobby[employer] along with smaller Company secretary. Despite my best ability to proofread, there is the potential that transcriptional errors may still occur from this process, and are completely unintentional.    Karen Kitchens, NP 11/22/18 1411

## 2019-07-29 ENCOUNTER — Encounter: Payer: Self-pay | Admitting: Emergency Medicine

## 2019-07-29 ENCOUNTER — Ambulatory Visit
Admission: EM | Admit: 2019-07-29 | Discharge: 2019-07-29 | Disposition: A | Discharge status: Home / Self Care | Payer: BC Managed Care – PPO | Attending: Family Medicine | Admitting: Family Medicine

## 2019-07-29 ENCOUNTER — Other Ambulatory Visit: Payer: Self-pay

## 2019-07-29 DIAGNOSIS — J0100 Acute maxillary sinusitis, unspecified: Secondary | ICD-10-CM | POA: Diagnosis not present

## 2019-07-29 MED ORDER — AMOXICILLIN 875 MG PO TABS: 875.0000 mg | ORAL_TABLET | Freq: Two times a day (BID) | ORAL | 0 refills | Status: DC

## 2019-07-29 NOTE — ED Provider Notes (Signed)
[facility]    CSN: [id number] Arrival date & time: 07/29/19  1026      History   Chief Complaint Chief Complaint  Patient presents with  . Sinus Problem    HPI Dyron Kawano is a 29 y.o. male.   29 yo male with a c/o sinus congestion, sinus pressure/headache, tooth/jaw discomfort for the past week. States he has seasonal allergies and every year around this time, he develops a sinus infection. Denies any fevers, chills, chest pains, shortness of breath, cough.    Sinus Problem    History reviewed. No pertinent past medical history.  There are no problems to display for this patient.   Past Surgical History:  Procedure Laterality Date  . NO PAST SURGERIES         Home Medications    Prior to Admission medications   Medication Sig Start Date End Date Taking? Authorizing Provider  amoxicillin (AMOXIL) 875 MG tablet Take 1 tablet (875 mg total) by mouth 2 (two) times daily. 07/29/19   Payton Mccallum, MD  doxycycline (VIBRAMYCIN) 100 MG capsule Take 1 capsule (100 mg total) by mouth 2 (two) times daily. 11/20/18   Lutricia Feil, PA-C    Family History Family History  Problem Relation Age of Onset  . Heart attack Mother   . Diabetes Father     Social History Social History   Tobacco Use  . Smoking status: Former Smoker    Quit date: 11/19/2016    Years since quitting: 2.6  . Smokeless tobacco: Never Used  Substance Use Topics  . Alcohol use: Yes    Comment: rare  . Drug use: No     Allergies   Patient has no known allergies.   Review of Systems Review of Systems   Physical Exam Triage Vital Signs ED Triage Vitals  Enc Vitals Group     BP 07/29/19 1045 126/83     Pulse Rate 07/29/19 1045 (!) 55     Resp 07/29/19 1045 14     Temp 07/29/19 1045 97.8 F (36.6 C)     Temp Source 07/29/19 1045 Oral     SpO2 07/29/19 1045 100 %     Weight 07/29/19 1043 160 lb (72.6 kg)     Height 07/29/19 1043 6' (1.829 m)     Head  Circumference --      Peak Flow --      Pain Score 07/29/19 1043 5     Pain Loc --      Pain Edu? --      Excl. in GC? --    No data found.  Updated Vital Signs BP 126/83 (BP Location: Left Arm)   Pulse (!) 55   Temp 97.8 F (36.6 C) (Oral)   Resp 14   Ht 6' (1.829 m)   Wt 72.6 kg   SpO2 100%   BMI 21.70 kg/m   Visual Acuity Right Eye Distance:   Left Eye Distance:   Bilateral Distance:    Right Eye Near:   Left Eye Near:    Bilateral Near:     Physical Exam Vitals and nursing note reviewed.  Constitutional:      General: He is not in acute distress.    Appearance: He is not toxic-appearing or diaphoretic.  HENT:     Right Ear: Tympanic membrane normal.     Left Ear: Tympanic membrane normal.     Nose: Congestion present.     Right Sinus: Maxillary sinus  tenderness present.     Left Sinus: Maxillary sinus tenderness present.     Mouth/Throat:     Pharynx: No oropharyngeal exudate or posterior oropharyngeal erythema.  Eyes:     Conjunctiva/sclera: Conjunctivae normal.  Cardiovascular:     Rate and Rhythm: Normal rate.  Pulmonary:     Effort: Pulmonary effort is normal. No respiratory distress.  Musculoskeletal:     Cervical back: Neck supple.  Neurological:     Mental Status: He is alert.      UC Treatments / Results  Labs (all labs ordered are listed, but only abnormal results are displayed) Labs Reviewed - No data to display  EKG   Radiology No results found.  Procedures Procedures (including critical care time)  Medications Ordered in UC Medications - No data to display  Initial Impression / Assessment and Plan / UC Course  I have reviewed the triage vital signs and the nursing notes.  Pertinent labs & imaging results that were available during my care of the patient were reviewed by me and considered in my medical decision making (see chart for details).      Final Clinical Impressions(s) / UC Diagnoses   Final diagnoses:  Acute  maxillary sinusitis, recurrence not specified    ED Prescriptions    Medication Sig Dispense Auth. Provider   amoxicillin (AMOXIL) 875 MG tablet Take 1 tablet (875 mg total) by mouth 2 (two) times daily. 20 tablet Norval Gable, MD      1. diagnosis reviewed with patient 2. rx as per orders above; reviewed possible side effects, interactions, risks and benefits  3. Recommend supportive treatment with otc steroid nasal spray 4. Follow-up prn if symptoms worsen or don't improve   PDMP not reviewed this encounter.   Norval Gable, MD 07/29/19 1123

## 2019-07-29 NOTE — ED Triage Notes (Signed)
Patient c/o sinus congestion and pressure and left sided jaw that started a week ago.  Patient denies fevers.

## 2019-08-06 ENCOUNTER — Other Ambulatory Visit: Payer: Self-pay

## 2019-08-06 ENCOUNTER — Encounter: Payer: Self-pay | Admitting: Emergency Medicine

## 2019-08-06 ENCOUNTER — Emergency Department: Payer: BC Managed Care – PPO

## 2019-08-06 ENCOUNTER — Emergency Department
Admission: EM | Admit: 2019-08-06 | Discharge: 2019-08-06 | Disposition: A | Discharge status: Home / Self Care | Payer: BC Managed Care – PPO | Attending: Emergency Medicine | Admitting: Emergency Medicine

## 2019-08-06 DIAGNOSIS — Z87891 Personal history of nicotine dependence: Secondary | ICD-10-CM | POA: Diagnosis not present

## 2019-08-06 DIAGNOSIS — L42 Pityriasis rosea: Secondary | ICD-10-CM

## 2019-08-06 DIAGNOSIS — R519 Headache, unspecified: Secondary | ICD-10-CM | POA: Diagnosis present

## 2019-08-06 DIAGNOSIS — K047 Periapical abscess without sinus: Secondary | ICD-10-CM | POA: Diagnosis not present

## 2019-08-06 LAB — CBC WITH DIFFERENTIAL/PLATELET
Abs Immature Granulocytes: 0.05 10*3/uL (ref 0.00–0.07)
Basophils Absolute: 0 10*3/uL (ref 0.0–0.1)
Basophils Relative: 0 %
Eosinophils Absolute: 0.1 10*3/uL (ref 0.0–0.5)
Eosinophils Relative: 1 %
HCT: 40.3 % (ref 39.0–52.0)
Hemoglobin: 14.1 g/dL (ref 13.0–17.0)
Immature Granulocytes: 1 %
Lymphocytes Relative: 23 %
Lymphs Abs: 2.6 10*3/uL (ref 0.7–4.0)
MCH: 31.5 pg (ref 26.0–34.0)
MCHC: 35 g/dL (ref 30.0–36.0)
MCV: 90 fL (ref 80.0–100.0)
Monocytes Absolute: 0.7 10*3/uL (ref 0.1–1.0)
Monocytes Relative: 6 %
Neutro Abs: 7.5 10*3/uL (ref 1.7–7.7)
Neutrophils Relative %: 69 %
Platelets: 242 10*3/uL (ref 150–400)
RBC: 4.48 MIL/uL (ref 4.22–5.81)
RDW: 11.5 % (ref 11.5–15.5)
WBC: 11 10*3/uL — ABNORMAL HIGH (ref 4.0–10.5)
nRBC: 0 % (ref 0.0–0.2)

## 2019-08-06 LAB — COMPREHENSIVE METABOLIC PANEL
ALT: 24 U/L (ref 0–44)
AST: 18 U/L (ref 15–41)
Albumin: 4.5 g/dL (ref 3.5–5.0)
Alkaline Phosphatase: 73 U/L (ref 38–126)
Anion gap: 8 (ref 5–15)
BUN: 13 mg/dL (ref 6–20)
CO2: 24 mmol/L (ref 22–32)
Calcium: 9.5 mg/dL (ref 8.9–10.3)
Chloride: 106 mmol/L (ref 98–111)
Creatinine, Ser: 0.88 mg/dL (ref 0.61–1.24)
GFR calc Af Amer: >60 mL/min (ref >60)
GFR calc non Af Amer: >60 mL/min (ref >60)
Glucose, Bld: 95 mg/dL (ref 70–99)
Potassium: 4 mmol/L (ref 3.5–5.1)
Sodium: 138 mmol/L (ref 135–145)
Total Bilirubin: 0.8 mg/dL (ref 0.3–1.2)
Total Protein: 7.4 g/dL (ref 6.5–8.1)

## 2019-08-06 MED ORDER — IOHEXOL 300 MG/ML  SOLN
75.0000 mL | Freq: Once | INTRAMUSCULAR | Status: AC | PRN
  Administered 2019-08-06: 75 mL via INTRAVENOUS
  Filled 2019-08-06: qty 75

## 2019-08-06 MED ORDER — SODIUM CHLORIDE 0.9 % IV BOLUS
1000.0000 mL | Freq: Once | INTRAVENOUS | Status: AC
  Administered 2019-08-06: 1000 mL via INTRAVENOUS

## 2019-08-06 MED ORDER — HYDROCODONE-ACETAMINOPHEN 5-325 MG PO TABS: 1.0000 | ORAL_TABLET | Freq: Four times a day (QID) | ORAL | 0 refills | Status: AC | PRN

## 2019-08-06 MED ORDER — CLINDAMYCIN HCL 300 MG PO CAPS
300.0000 mg | ORAL_CAPSULE | Freq: Three times a day (TID) | ORAL | 0 refills | Status: AC
Start: 2019-08-06 — End: 2019-08-16

## 2019-08-06 NOTE — ED Notes (Signed)
See triage note  Presents with facial pain  States recently treated for sinusitis  Pain is radiating into jaw

## 2019-08-06 NOTE — ED Triage Notes (Signed)
C/O left facial sinus pain and jaw pain x 10 days.  States he has history of sinus infections, but this pain has persisted.  AAOx3.  Skin warm and dry.  NAD

## 2019-08-06 NOTE — ED Provider Notes (Signed)
[facility] Emergency Department Provider Note ____________________________________________   First MD Initiated Contact with Patient 08/06/19 1603     (approximate)  I have reviewed the triage vital signs and the nursing notes.   HISTORY  Chief Complaint Facial Pain  HPI Charles Dunlap is a 29 y.o. male presents to the emergency department for treatment and evaluation of facial pain.  Pain has been present for more than 1 month.  10 days ago, he decided to go to urgent care due to facial swelling.  He was prescribed antibiotics which he has now finished.  Pain has not been relieved but he has no longer had facial swelling.  He states that he gets recurrent sinusitis which gives him similar symptoms.  He states that it typically does not last this long.  He states that he did have a fever a couple of nights ago.     History reviewed. No pertinent past medical history.  There are no problems to display for this patient.   Past Surgical History:  Procedure Laterality Date  . NO PAST SURGERIES      Prior to Admission medications   Medication Sig Start Date End Date Taking? Authorizing Provider  amoxicillin (AMOXIL) 875 MG tablet Take 1 tablet (875 mg total) by mouth 2 (two) times daily. 07/29/19   Payton Mccallum, MD  clindamycin (CLEOCIN) 300 MG capsule Take 1 capsule (300 mg total) by mouth 3 (three) times daily for 10 days. 08/06/19 08/16/19  Samvel Zinn, Rulon Eisenmenger B, FNP  doxycycline (VIBRAMYCIN) 100 MG capsule Take 1 capsule (100 mg total) by mouth 2 (two) times daily. 11/20/18   Lutricia Feil, PA-C  HYDROcodone-acetaminophen (NORCO/VICODIN) 5-325 MG tablet Take 1 tablet by mouth every 6 (six) hours as needed for up to 3 days for severe pain. 08/06/19 08/09/19  Chinita Pester, FNP    Allergies Patient has no known allergies.  Family History  Problem Relation Age of Onset  . Heart attack Mother   . Diabetes Father     Social History Social History    Tobacco Use  . Smoking status: Former Smoker    Quit date: 11/19/2016    Years since quitting: 2.7  . Smokeless tobacco: Never Used  Vaping Use  . Vaping Use: Never used  Substance Use Topics  . Alcohol use: Yes    Comment: rare  . Drug use: No    Review of Systems  Constitutional: Positive for fever/chills Eyes: No visual changes. ENT: No sore throat. Positive for facial tenderness on left. Cardiovascular: Denies chest pain. Respiratory: Denies shortness of breath. Gastrointestinal: No abdominal pain.  No nausea, no vomiting.  No diarrhea.  No constipation. Genitourinary: Negative for dysuria. Musculoskeletal: Negative for back pain. Skin: Positive for rash. Neurological: Positive for headaches, negative for focal weakness or numbness. ____________________________________________   PHYSICAL EXAM:  VITAL SIGNS: ED Triage Vitals  Enc Vitals Group     BP 08/06/19 1422 123/77     Pulse Rate 08/06/19 1422 93     Resp 08/06/19 1422 18     Temp 08/06/19 1422 97.8 F (36.6 C)     Temp Source 08/06/19 1422 Oral     SpO2 08/06/19 1422 98 %     Weight 08/06/19 1332 160 lb 0.9 oz (72.6 kg)     Height 08/06/19 1332 6' (1.829 m)     Head Circumference --      Peak Flow --      Pain Score 08/06/19 1332 8  Pain Loc --      Pain Edu? --      Excl. in GC? --     Constitutional: Alert and oriented. Uncomfortable appearing and in no acute distress. Eyes: Conjunctivae are normal. Head: Atraumatic. Nose: No congestion/rhinnorhea. Tenderness over left maxillary sinus.  Mouth/Throat: Mucous membranes are moist.  Oropharynx non-erythematous. Multiple fillings on upper left. No gingival erythema or edema. No fluctuant pocket. Neck: No stridor.   Hematological/Lymphatic/Immunilogical: No cervical lymphadenopathy. Cardiovascular: Normal rate, regular rhythm. Grossly normal heart sounds.  Good peripheral circulation. Respiratory: Normal respiratory effort.  No retractions. Lungs  CTAB. Gastrointestinal: Soft and nontender. No distention. No abdominal bruits. No CVA tenderness. Genitourinary:  Musculoskeletal: No lower extremity tenderness nor edema.  No joint effusions. Neurologic:  Normal speech and language. No gross focal neurologic deficits are appreciated. No gait instability. Skin:  Skin is warm, dry and intact.  Oval lesions on the left shoulder and trunk/back with pink scales. Psychiatric: Mood and affect are normal. Speech and behavior are normal.  ____________________________________________   LABS (all labs ordered are listed, but only abnormal results are displayed)  Labs Reviewed  CBC WITH DIFFERENTIAL/PLATELET - Abnormal; Notable for the following components:      Result Value   WBC 11.0 (*)    All other components within normal limits  COMPREHENSIVE METABOLIC PANEL   ____________________________________________  EKG  Not indicated. ____________________________________________  RADIOLOGY  ED MD interpretation:    CT Maxillofacial with contrast shows periapical lucency around left upper 2nd and 3rd molars. No periosteal abscess.  Official radiology report(s): CT Maxillofacial W Contrast  Result Date: 08/06/2019 CLINICAL DATA:  Left facial and sinus pain 10 days. EXAM: CT MAXILLOFACIAL WITH CONTRAST TECHNIQUE: Multidetector CT imaging of the maxillofacial structures was performed with intravenous contrast. Multiplanar CT image reconstructions were also generated. CONTRAST:  67mL OMNIPAQUE IOHEXOL 300 MG/ML  SOLN COMPARISON:  None. FINDINGS: Osseous: Negative for fracture.  No skeletal mass lesion Multiple dental caries left upper and lower molars. Periapical lucency around left upper second and third molars. Caries right upper pre molar and right upper third molar. Orbits: Negative for mass or edema.  Normal globe. Sinuses: Mucosal edema in the maxillary sinus bilaterally left greater than right. Possible odontogenic sinusitis. Remaining  sinuses clear. Mastoid clear bilaterally. Soft tissues: Negative for soft tissue mass or abscess. No edema or cellulitis. Limited intracranial: Negative IMPRESSION: Bilateral dental caries, left greater than right. Periapical lucency around left upper second and third molars. No soft tissue periosteal abscess or cellulitis. Maxillary sinus bilaterally left greater than right possibly odontogenic. Electronically Signed   By: Marlan Palau M.D.   On: 08/06/2019 17:39    ____________________________________________   PROCEDURES  Procedure(s) performed (including Critical Care):  Procedures  ____________________________________________   INITIAL IMPRESSION / ASSESSMENT AND PLAN     29 year old male presenting to the emergency department for treatment and evaluation of facial pain.  See HPI for further details.  Due to the length of time since onset of pain and failed outpatient antibiotics, plan will be to get some labs as well as a CT maxillofacial with contrast to rule out abscess.  Patient is aware and agrees to the plan.  DIFFERENTIAL DIAGNOSIS  Dental abscess, septal abscess, sinusitis  ED COURSE  Labs are overall reassuring.  He has a very mild leukocytosis at 11.0.  CT maxillofacial with contrast does show a lucency around the periapical space of the second and third molars.  Plan will be to put him on  clindamycin and provide a short round of pain medication.  He will be advised to call and schedule an appointment with his dentist within the next 2 weeks.  He was advised that the rash should be self limiting and go away without treatment. He is to see PCP for concerns. ____________________________________________   FINAL CLINICAL IMPRESSION(S) / ED DIAGNOSES  Final diagnoses:  Acute periapical abscess  Pityriasis rosea     ED Discharge Orders         Ordered    clindamycin (CLEOCIN) 300 MG capsule  3 times daily     Discontinue  Reprint     08/06/19 1827     HYDROcodone-acetaminophen (NORCO/VICODIN) 5-325 MG tablet  Every 6 hours PRN     Discontinue  Reprint     08/06/19 1827           Charles Dunlap was evaluated in Emergency Department on 08/06/2019 for the symptoms described in the history of present illness. He was evaluated in the context of the global COVID-19 pandemic, which necessitated consideration that the patient might be at risk for infection with the SARS-CoV-2 virus that causes COVID-19. Institutional protocols and algorithms that pertain to the evaluation of patients at risk for COVID-19 are in a state of rapid change based on information released by regulatory bodies including the CDC and federal and state organizations. These policies and algorithms were followed during the patient's care in the ED.   Note:  This document was prepared using Dragon voice recognition software and may include unintentional dictation errors.   Victorino Dike, FNP 08/06/19 1842    Delman Kitten, MD 08/06/19 2324

## 2020-06-12 ENCOUNTER — Emergency Department
Admission: EM | Admit: 2020-06-12 | Discharge: 2020-06-13 | Disposition: A | Discharge status: Home / Self Care | Payer: BC Managed Care – PPO | Attending: Emergency Medicine | Admitting: Emergency Medicine

## 2020-06-12 ENCOUNTER — Other Ambulatory Visit: Payer: Self-pay

## 2020-06-12 ENCOUNTER — Ambulatory Visit (INDEPENDENT_AMBULATORY_CARE_PROVIDER_SITE_OTHER): Payer: BC Managed Care – PPO

## 2020-06-12 ENCOUNTER — Emergency Department: Payer: BC Managed Care – PPO

## 2020-06-12 ENCOUNTER — Encounter: Payer: Self-pay | Admitting: Emergency Medicine

## 2020-06-12 ENCOUNTER — Ambulatory Visit
Admission: EM | Admit: 2020-06-12 | Discharge: 2020-06-12 | Disposition: A | Discharge status: Home / Self Care | Payer: BC Managed Care – PPO | Source: Home / Self Care

## 2020-06-12 DIAGNOSIS — R1032 Left lower quadrant pain: Secondary | ICD-10-CM | POA: Diagnosis present

## 2020-06-12 DIAGNOSIS — N200 Calculus of kidney: Secondary | ICD-10-CM

## 2020-06-12 DIAGNOSIS — R109 Unspecified abdominal pain: Secondary | ICD-10-CM

## 2020-06-12 DIAGNOSIS — Z87891 Personal history of nicotine dependence: Secondary | ICD-10-CM | POA: Diagnosis not present

## 2020-06-12 DIAGNOSIS — Z87442 Personal history of urinary calculi: Secondary | ICD-10-CM | POA: Diagnosis not present

## 2020-06-12 HISTORY — DX: Calculus of kidney: N20.0

## 2020-06-12 LAB — CBC WITH DIFFERENTIAL/PLATELET
Abs Immature Granulocytes: 0.06 10*3/uL (ref 0.00–0.07)
Basophils Absolute: 0 10*3/uL (ref 0.0–0.1)
Basophils Relative: 0 %
Eosinophils Absolute: 0 10*3/uL (ref 0.0–0.5)
Eosinophils Relative: 0 %
HCT: 39.8 % (ref 39.0–52.0)
Hemoglobin: 14.2 g/dL (ref 13.0–17.0)
Immature Granulocytes: 1 %
Lymphocytes Relative: 15 %
Lymphs Abs: 1.8 10*3/uL (ref 0.7–4.0)
MCH: 31.8 pg (ref 26.0–34.0)
MCHC: 35.7 g/dL (ref 30.0–36.0)
MCV: 89.2 fL (ref 80.0–100.0)
Monocytes Absolute: 1.6 10*3/uL — ABNORMAL HIGH (ref 0.1–1.0)
Monocytes Relative: 13 %
Neutro Abs: 8.5 10*3/uL — ABNORMAL HIGH (ref 1.7–7.7)
Neutrophils Relative %: 71 %
Platelets: 246 10*3/uL (ref 150–400)
RBC: 4.46 MIL/uL (ref 4.22–5.81)
RDW: 11.9 % (ref 11.5–15.5)
WBC: 12 10*3/uL — ABNORMAL HIGH (ref 4.0–10.5)
nRBC: 0 % (ref 0.0–0.2)

## 2020-06-12 LAB — URINALYSIS, COMPLETE (UACMP) WITH MICROSCOPIC
Glucose, UA: NEGATIVE mg/dL
Ketones, ur: 40 mg/dL — AB
Leukocytes,Ua: NEGATIVE
Nitrite: NEGATIVE
Protein, ur: 100 mg/dL — AB
RBC / HPF: >50 RBC/hpf (ref 0–5)
Specific Gravity, Urine: 1.02 (ref 1.005–1.030)
pH: 5.5 (ref 5.0–8.0)

## 2020-06-12 MED ORDER — ONDANSETRON 8 MG PO TBDP
8.0000 mg | ORAL_TABLET | Freq: Once | ORAL | Status: AC
  Administered 2020-06-12: 8 mg via ORAL

## 2020-06-12 NOTE — ED Triage Notes (Addendum)
Pt arrived via POV with reports of L groin pain off and on x 1 month, but worse over the past 2 days, pt also reports some L side flank pain off and on, none currently. Pt has hx of kidney stones.   Pt was seen at [facility] had XR done where stone was noted. Sent to ED for CT scan.

## 2020-06-12 NOTE — ED Notes (Signed)
Patient is being discharged from the Urgent Care and sent to the Emergency Department via POV . Per provider, patient is in need of higher level of care due to elevated WBC. Patient is aware and verbalizes understanding of plan of care.  Vitals:   06/12/20 1907  BP: (!) 143/84  Pulse: (!) 103  Resp: 18  Temp: 98.7 F (37.1 C)  SpO2: 98%

## 2020-06-12 NOTE — ED Triage Notes (Signed)
Pt c/o left sided groin pain. Started about a month ago but has recently gotten worse in the last 2 days. He states he has had kidney stones before but this does NOT feel that way. 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

## 2020-06-12 NOTE — ED Provider Notes (Signed)
[facility]    CSN: [id number] Arrival date & time: 06/12/20  1851      History   Chief Complaint Chief Complaint  Patient presents with  . Groin Pain    left    HPI Charles Dunlap is a 30 y.o. male. who presents with intermittent L groin and suprapubic pain x 1 month. Last night had a low grade temp of 99 and felt very fagued with nausea. Vomited this pm x  With onset of severe pain.  Had covid 6 months ago.  Pain is worse with drinking and if  ate too much.  Has lost his appetite since yesterday afternoon. Has not been hungry at all.  Has had renal stones In the past but this feels different. Denies dysuria, abdominal distention, penile discharge, or testicular pain.   History reviewed. No pertinent past medical history.  There are no problems to display for this patient.   Past Surgical History:  Procedure Laterality Date  . NO PAST SURGERIES      Home Medications    Prior to Admission medications   Not on File    Family History Family History  Problem Relation Age of Onset  . Heart attack Mother   . Diabetes Father     Social History Social History   Tobacco Use  . Smoking status: Former Smoker    Quit date: 11/19/2016    Years since quitting: 3.5  . Smokeless tobacco: Never Used  Vaping Use  . Vaping Use: Never used  Substance Use Topics  . Alcohol use: Yes    Comment: rare  . Drug use: No     Allergies   Patient has no known allergies.   Review of Systems Review of Systems  Constitutional: Positive for appetite change, chills, fatigue and fever.  HENT: Positive for congestion, postnasal drip and rhinorrhea.        From her allergies   Eyes: Negative for discharge.  Respiratory: Negative for cough.   Gastrointestinal: Positive for abdominal pain, nausea and vomiting. Negative for constipation and diarrhea.  Genitourinary: Positive for frequency. Negative for dysuria, flank pain, penile pain, penile swelling, scrotal  swelling and testicular pain.  Skin: Negative for rash.  Hematological: Negative for adenopathy.     Physical Exam Triage Vital Signs ED Triage Vitals  Enc Vitals Group     BP 06/12/20 1907 (!) 143/84     Pulse Rate 06/12/20 1907 (!) 103     Resp 06/12/20 1907 18     Temp 06/12/20 1907 98.7 F (37.1 C)     Temp Source 06/12/20 1907 Oral     SpO2 06/12/20 1907 98 %     Weight 06/12/20 1908 160 lb 0.9 oz (72.6 kg)     Height 06/12/20 1908 6' (1.829 m)     Head Circumference --      Peak Flow --      Pain Score 06/12/20 1908 6     Pain Loc --      Pain Edu? --      Excl. in GC? --    No data found.  Updated Vital Signs BP (!) 143/84 (BP Location: Right Arm)   Pulse (!) 103   Temp 98.7 F (37.1 C) (Oral)   Resp 18   Ht 6' (1.829 m)   Wt 160 lb 0.9 oz (72.6 kg)   SpO2 98%   BMI 21.71 kg/m   Visual Acuity Right Eye Distance:   Left Eye Distance:  Bilateral Distance:    Right Eye Near:   Left Eye Near:    Bilateral Near:     Physical Exam Vitals and nursing note reviewed.  Constitutional:      General: He is not in acute distress.    Appearance: He is not toxic-appearing.  HENT:     Head: Normocephalic.     Right Ear: External ear normal.     Left Ear: External ear normal.  Eyes:     General: No scleral icterus.    Conjunctiva/sclera: Conjunctivae normal.  Pulmonary:     Effort: Pulmonary effort is normal.  Abdominal:     General: Bowel sounds are normal.     Palpations: Abdomen is soft. There is no mass.     Tenderness: There is no abdominal tenderness. There is no right CVA tenderness or left CVA tenderness.  Genitourinary:    Penis: Normal.      Testes: Normal.     Comments: No inguinal nodes or tenderness on this area noted Musculoskeletal:        General: Normal range of motion.     Cervical back: Neck supple.  Skin:    General: Skin is warm and dry.  Neurological:     Mental Status: He is alert and oriented to person, place, and time.      Gait: Gait normal.  Psychiatric:        Mood and Affect: Mood normal.        Behavior: Behavior normal.        Thought Content: Thought content normal.        Judgment: Judgment normal.    UC Treatments / Results  Labs (all labs ordered are listed, but only abnormal results are displayed) Labs Reviewed  CBC WITH DIFFERENTIAL/PLATELET - Abnormal; Notable for the following components:      Result Value   WBC 12.0 (*)    Neutro Abs 8.5 (*)    Monocytes Absolute 1.6 (*)    All other components within normal limits  URINALYSIS, COMPLETE (UACMP) WITH MICROSCOPIC - Abnormal; Notable for the following components:   Color, Urine BROWN (*)    APPearance TURBID (*)    Hgb urine dipstick LARGE (*)    Bilirubin Urine SMALL (*)    Ketones, ur 40 (*)    Protein, ur 100 (*)    Bacteria, UA MANY (*)    All other components within normal limits  URINE CULTURE    EKG   Radiology DG Abd 2 Views  Result Date: 06/12/2020 CLINICAL DATA:  Abdominal pain EXAM: ABDOMEN - 2 VIEW COMPARISON:  09/23/2014 FINDINGS: Scattered large and small bowel gas is noted. Mild retained fecal material is noted consistent with a degree of constipation. No free air is noted. There is a 5-6 mm calcification over the lower pole of the right kidney consistent with nonobstructing renal stone. There is a questionable calcification over the left sacrum which could represent a left ureteral stone given the patient's clinical history. No bony abnormality is noted. IMPRESSION: Nonobstructing right renal stone. Questionable calcification over the sacrum on the left which could represent a left ureteral stone given the patient's clinical history. CT urography would be helpful for further evaluation. Electronically Signed   By: Alcide Clever M.D.   On: 06/12/2020 20:22    Procedures Procedures (including critical care time)  Medications Ordered in UC Medications  ondansetron (ZOFRAN-ODT) disintegrating tablet 8 mg (8 mg Oral  Given 06/12/20 2002)    Initial Impression /  Assessment and Plan / UC Course  I have reviewed the triage vital signs and the nursing notes. Pertinent labs & imaging results that were available during my care of the patient were reviewed by me and considered in my medical decision making (see chart for details). May have L renal colic and also has slight elevation of his white cell count.  I sent him to ER for further work up.    Final Clinical Impressions(s) / UC Diagnoses   Final diagnoses:  Renal stone     Discharge Instructions     Your xray shows the following and you need to go to ER to get a cat scan tonight.   FINDINGS: Scattered large and small bowel gas is noted. Mild retained fecal material is noted consistent with a degree of constipation. No free air is noted. There is a 5-6 mm calcification over the lower pole of the right kidney consistent with nonobstructing renal stone. There is a questionable calcification over the left sacrum which could represent a left ureteral stone given the patient's clinical history. No bony abnormality is noted.   IMPRESSION: Nonobstructing right renal stone.   Questionable calcification over the sacrum on the left which could represent a left ureteral stone given the patient's clinical history. CT urography would be helpful for further evaluation.     Electronically Signed   By: Alcide Clever M.D.   On: 06/12/2020 20:22    ED Prescriptions    None     PDMP not reviewed this encounter.   Garey Ham, Cordelia Poche 06/12/20 2046

## 2020-06-12 NOTE — ED Notes (Signed)
D/w Dr. Derrill Kay, no additional orders at this time.

## 2020-06-12 NOTE — Discharge Instructions (Addendum)
Your xray shows the following and you need to go to ER to get a cat scan tonight.   FINDINGS: Scattered large and small bowel gas is noted. Mild retained fecal material is noted consistent with a degree of constipation. No free air is noted. There is a 5-6 mm calcification over the lower pole of the right kidney consistent with nonobstructing renal stone. There is a questionable calcification over the left sacrum which could represent a left ureteral stone given the patient's clinical history. No bony abnormality is noted.   IMPRESSION: Nonobstructing right renal stone.   Questionable calcification over the sacrum on the left which could represent a left ureteral stone given the patient's clinical history. CT urography would be helpful for further evaluation.     Electronically Signed   By: Alcide Clever M.D.   On: 06/12/2020 20:22

## 2020-06-13 MED ORDER — HYDROCODONE-ACETAMINOPHEN 5-325 MG PO TABS: 1.0000 | ORAL_TABLET | ORAL | 0 refills | Status: DC | PRN

## 2020-06-13 MED ORDER — TAMSULOSIN HCL 0.4 MG PO CAPS
0.4000 mg | ORAL_CAPSULE | Freq: Every day | ORAL | 0 refills | Status: AC
Start: 2020-06-13 — End: 2020-06-20

## 2020-06-13 MED ORDER — KETOROLAC TROMETHAMINE 60 MG/2ML IM SOLN
60.0000 mg | Freq: Four times a day (QID) | INTRAMUSCULAR | Status: DC | PRN
  Administered 2020-06-13: 60 mg via INTRAMUSCULAR
  Filled 2020-06-13: qty 2

## 2020-06-13 NOTE — ED Notes (Signed)
Vital signs stable. 

## 2020-06-13 NOTE — ED Provider Notes (Signed)
[facility] Emergency Department Provider Note   ____________________________________________   Event Date/Time   First MD Initiated Contact with Patient 06/12/20 2327     (approximate)  I have reviewed the triage vital signs and the nursing notes.   HISTORY  Chief Complaint Groin Pain    HPI Charles Dunlap is a 30 y.o. male with a stated past medical history of renal stones who presents for for left-sided flank pain that he states is been on and off for the past month but has been significantly worsening over the past 2 days.  Patient describes sharp, left lower abdominal quadrant and left flank pain that radiates into the left groin and is associated with nausea.  Patient states that the pain has been as high as a 10/10 but is currently a 2-3/10.  Patient denies any exacerbating or relieving factors.  Patient currently denies any vision changes, tinnitus, difficulty speaking, facial droop, sore throat, chest pain, vomiting/diarrhea, dysuria, or weakness/numbness/paresthesias in any extremity         Past Medical History:  Diagnosis Date  . Renal stone     There are no problems to display for this patient.   Past Surgical History:  Procedure Laterality Date  . NO PAST SURGERIES      Prior to Admission medications   Medication Sig Start Date End Date Taking? Authorizing Provider  HYDROcodone-acetaminophen (NORCO) 5-325 MG tablet Take 1 tablet by mouth every 4 (four) hours as needed for moderate pain. 06/13/20  Yes Merwyn Katos, MD  tamsulosin (FLOMAX) 0.4 MG CAPS capsule Take 1 capsule (0.4 mg total) by mouth daily for 7 days. 06/13/20 06/20/20 Yes Merwyn Katos, MD    Allergies Patient has no known allergies.  Family History  Problem Relation Age of Onset  . Heart attack Mother   . Diabetes Father     Social History Social History   Tobacco Use  . Smoking status: Former Smoker    Quit date: 11/19/2016    Years since  quitting: 3.5  . Smokeless tobacco: Never Used  Vaping Use  . Vaping Use: Never used  Substance Use Topics  . Alcohol use: Yes    Comment: rare  . Drug use: No    Review of Systems Constitutional: No fever/chills Eyes: No visual changes. ENT: No sore throat. Cardiovascular: Denies chest pain. Respiratory: Denies shortness of breath. Gastrointestinal: Endorses left flank and groin pain.  No nausea, no vomiting.  No diarrhea. Genitourinary: Negative for dysuria. Musculoskeletal: Negative for acute arthralgias Skin: Negative for rash. Neurological: Negative for headaches, weakness/numbness/paresthesias in any extremity Psychiatric: Negative for suicidal ideation/homicidal ideation   ____________________________________________   PHYSICAL EXAM:  VITAL SIGNS: ED Triage Vitals  Enc Vitals Group     BP 06/12/20 2133 122/89     Pulse Rate 06/12/20 2133 79     Resp 06/12/20 2133 20     Temp 06/12/20 2133 98.5 F (36.9 C)     Temp Source 06/12/20 2133 Oral     SpO2 06/12/20 2133 98 %     Weight 06/12/20 2134 160 lb 0.9 oz (72.6 kg)     Height 06/12/20 2134 6' (1.829 m)     Head Circumference --      Peak Flow --      Pain Score 06/12/20 2134 6     Pain Loc --      Pain Edu? --      Excl. in GC? --    Constitutional: Alert  and oriented. Well appearing and in no acute distress. Eyes: Conjunctivae are normal. PERRL. Head: Atraumatic. Nose: No congestion/rhinnorhea. Mouth/Throat: Mucous membranes are moist. Neck: No stridor Cardiovascular: Grossly normal heart sounds.  Good peripheral circulation. Respiratory: Normal respiratory effort.  No retractions. Gastrointestinal: Left CVA tenderness to percussion.  Soft and nontender. No distention. Musculoskeletal: No obvious deformities Neurologic:  Normal speech and language. No gross focal neurologic deficits are appreciated. Skin:  Skin is warm and dry. No rash noted. Psychiatric: Mood and affect are normal. Speech and  behavior are normal.  ____________________________________________   LABS (all labs ordered are listed, but only abnormal results are displayed)  Labs Reviewed  URINALYSIS, COMPLETE (UACMP) WITH MICROSCOPIC   RADIOLOGY  ED MD interpretation: CT of the abdomen and pelvis shows a 7 mm stone in the left mid to distal ureter  Official radiology report(s): DG Abd 2 Views  Result Date: 06/12/2020 CLINICAL DATA:  Abdominal pain EXAM: ABDOMEN - 2 VIEW COMPARISON:  09/23/2014 FINDINGS: Scattered large and small bowel gas is noted. Mild retained fecal material is noted consistent with a degree of constipation. No free air is noted. There is a 5-6 mm calcification over the lower pole of the right kidney consistent with nonobstructing renal stone. There is a questionable calcification over the left sacrum which could represent a left ureteral stone given the patient's clinical history. No bony abnormality is noted. IMPRESSION: Nonobstructing right renal stone. Questionable calcification over the sacrum on the left which could represent a left ureteral stone given the patient's clinical history. CT urography would be helpful for further evaluation. Electronically Signed   By: Alcide Clever M.D.   On: 06/12/2020 20:22   CT Renal Stone Study  Result Date: 06/12/2020 CLINICAL DATA:  LLQ pain EXAM: CT ABDOMEN AND PELVIS WITHOUT CONTRAST TECHNIQUE: Multidetector CT imaging of the abdomen and pelvis was performed following the standard protocol without IV contrast. COMPARISON:  None. FINDINGS: Lower chest: No acute abnormality. Hepatobiliary: No focal liver abnormality is seen. No gallstones, gallbladder wall thickening, or biliary dilatation. Pancreas: Unremarkable. No pancreatic ductal dilatation or surrounding inflammatory changes. Spleen: Normal in size without focal abnormality. Adrenals/Urinary Tract: Adrenal glands are normal. Multiple intrarenal stones measuring up to 6 mm midpole right and 5 mm left lower  pole. Mild to moderate left hydronephrosis and hydroureter secondary to a 7 mm stone in the mid to distal left ureter at the L5 level. Bladder is normal. Stomach/Bowel: Stomach is within normal limits. Appendix appears normal. No evidence of bowel wall thickening, distention, or inflammatory changes. Vascular/Lymphatic: No significant vascular findings are present. No enlarged abdominal or pelvic lymph nodes. Reproductive: Prostate is unremarkable. Other: No abdominal wall hernia or abnormality. No abdominopelvic ascites. Musculoskeletal: No acute or significant osseous findings. IMPRESSION: 1. Mild to moderate left hydronephrosis and hydroureter secondary to a 7 mm stone in the mid to distal left ureter at the L5 level. 2. Multiple intrarenal stones. Electronically Signed   By: Jasmine Pang M.D.   On: 06/12/2020 23:53    ____________________________________________   PROCEDURES  Procedure(s) performed (including Critical Care):  .1-3 Lead EKG Interpretation Performed by: Merwyn Katos, MD Authorized by: Merwyn Katos, MD     Interpretation: normal     ECG rate:  95   ECG rate assessment: normal     Rhythm: sinus rhythm     Ectopy: none     Conduction: normal       ____________________________________________   INITIAL IMPRESSION / ASSESSMENT AND PLAN /  ED COURSE  As part of my medical decision making, I reviewed the following data within the electronic MEDICAL RECORD NUMBER Nursing notes reviewed and incorporated, Labs reviewed, Old chart reviewed, Radiograph reviewed and Notes from prior ED visits reviewed and incorporated        Patient presents for severe flank pain. Presentation most consistent with Renal Colic from a Non-infected Kidney Stone. Given History and Exam I have lower suspicion for atypical appendicitis, genital torsion, acute cholecystitis, AAA, Aortic Dissection, Serious Bacterial Illness or other emergent intraabdominal pathology.  Workup: CBC, BMP, CT  Abd/Pelvis noncontrast, UA, reassess Findings: 7 mm left kidney stone Reassesment: Patient tolerating PO and pain controlled Disposition:  Discharge. Strict return precautions for infected stone or PO intolerance discussed.      ____________________________________________   FINAL CLINICAL IMPRESSION(S) / ED DIAGNOSES  Final diagnoses:  Kidney stone  Left flank pain     ED Discharge Orders         Ordered    tamsulosin (FLOMAX) 0.4 MG CAPS capsule  Daily        06/13/20 0212    HYDROcodone-acetaminophen (NORCO) 5-325 MG tablet  Every 4 hours PRN        06/13/20 0212           Note:  This document was prepared using Dragon voice recognition software and may include unintentional dictation errors.   Merwyn Katos, MD 06/13/20 [phone]

## 2020-06-13 NOTE — Discharge Instructions (Signed)
Please use ibuprofen or naproxen for continued pain and the Norco for any breakthrough pain on top of that.

## 2020-06-14 LAB — URINE CULTURE: Culture: NO GROWTH

## 2022-06-13 ENCOUNTER — Emergency Department
Admission: EM | Admit: 2022-06-13 | Discharge: 2022-06-13 | Disposition: A | Discharge status: Home / Self Care | Payer: BC Managed Care – PPO | Attending: Emergency Medicine | Admitting: Emergency Medicine

## 2022-06-13 DIAGNOSIS — S0501XA Injury of conjunctiva and corneal abrasion without foreign body, right eye, initial encounter: Secondary | ICD-10-CM

## 2022-06-13 DIAGNOSIS — X58XXXA Exposure to other specified factors, initial encounter: Secondary | ICD-10-CM | POA: Insufficient documentation

## 2022-06-13 MED ORDER — TETRACAINE HCL 0.5 % OP SOLN
1.0000 [drp] | Freq: Once | OPHTHALMIC | Status: AC
  Administered 2022-06-13: 1 [drp] via OPHTHALMIC
  Filled 2022-06-13: qty 4

## 2022-06-13 MED ORDER — FLUORESCEIN SODIUM 1 MG OP STRP
1.0000 | ORAL_STRIP | Freq: Once | OPHTHALMIC | Status: AC
  Administered 2022-06-13: 1 via OPHTHALMIC
  Filled 2022-06-13: qty 1

## 2022-06-13 MED ORDER — ERYTHROMYCIN 5 MG/GM OP OINT: 1.0000 | TOPICAL_OINTMENT | Freq: Four times a day (QID) | OPHTHALMIC | 0 refills | Status: AC

## 2022-06-13 MED ORDER — ERYTHROMYCIN 5 MG/GM OP OINT
TOPICAL_OINTMENT | Freq: Once | OPHTHALMIC | Status: AC
  Administered 2022-06-13: 1 via OPHTHALMIC
  Filled 2022-06-13: qty 1

## 2022-06-13 NOTE — Discharge Instructions (Addendum)
Please take Tylenol and ibuprofen/Advil for your pain.  It is safe to take them together, or to alternate them every few hours.  Take up to  of Tylenol at a time, up to 4 times per day.  Do not take more than 4000 mg of Tylenol in 24 hours.  For ibuprofen, take 400-600 mg, 3 - 4 times per day.  Eye patches can be helpful for the pain as well  Use the erythromycin antibiotic ointment 3-4 times per day to help prevent infection.  Please follow-up with the ophthalmologist the clinic later this week for recheck.

## 2022-06-13 NOTE — ED Provider Notes (Signed)
[facility] Provider Note    Event Date/Time   First MD Initiated Contact with Patient 06/13/22 [phone]     (approximate)   History   Eye Pain   HPI  Charles Dunlap is a 32 y.o. male who presents to the ED for evaluation of Eye Pain   Generally healthy 32 year old presents to the ED alongside his wife for evaluation of right eye pain, redness and foreign body sensation.  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  He did use an angle grinder and mowed grass, but denies any injuries or noticing anything happening while he was doing these activities.  Slowly increasing pain and foreign body sensation throughout the evening, reports pain worsened overnight and awoke him from sleep.  Residual foreign body sensation.  Visual acuity intact.  No corrective lenses or contacts.  No trauma, injuries, falls, fever./Does report positive photophobia   Physical Exam   Triage Vital Signs: ED Triage Vitals  Enc Vitals Group     BP 06/13/22 0217 (!) 127/90     Pulse Rate 06/13/22 0217 (!) 51     Resp 06/13/22 0217 18     Temp 06/13/22 0217 97.8 F (36.6 C)     Temp Source 06/13/22 0217 Oral     SpO2 06/13/22 0217 98 %     Weight 06/13/22 0234 160 lb (72.6 kg)     Height 06/13/22 0234  (1.778 m)     Head Circumference --      Peak Flow --      Pain Score --      Pain Loc --      Pain Edu? --      Excl. in GC? --     Most recent vital signs: Vitals:   06/13/22 0217  BP: (!) 127/90  Pulse: (!) 51  Resp: 18  Temp: 97.8 F (36.6 C)  SpO2: 98%    General: Awake, no distress.  CV:  Good peripheral perfusion.  Resp:  Normal effort.  Abd:  No distention.  MSK:  No deformity noted.  Neuro:  No focal deficits appreciated. Other:  Right eyes injected, left does not.  Pupils are PERRL and visual acuity intact.  Fluorescein staining with areas of uptake throughout the 6-9:00 positions, all outside of the pupil area.  No Seidel  sign.  EOM intact.  No rust rings or foreign bodies appreciated with lid eversion   ED Results / Procedures / Treatments   Labs (all labs ordered are listed, but only abnormal results are displayed) Labs Reviewed - No data to display  EKG   RADIOLOGY   Official radiology report(s): No results found.  PROCEDURES and INTERVENTIONS:  Procedures  Medications  erythromycin ophthalmic ointment (has no administration in time range)  fluorescein ophthalmic strip 1 strip (1 strip Right Eye Given 06/13/22 0507)  tetracaine (PONTOCAINE) 0.5 % ophthalmic solution 1 drop (1 drop Right Eye Given 06/13/22 0507)     IMPRESSION / MDM / ASSESSMENT AND PLAN / ED COURSE  I reviewed the triage vital signs and the nursing notes.  Differential diagnosis includes, but is not limited to, corneal abrasion, foreign body, uveitis, open globe  {Patient presents with symptoms of an acute illness or injury that is potentially life-threatening.  32 year old presents with right eye pain and evidence of corneal abrasion suitable for outpatient management.  Fluorescein stain with uptake multiple areas to the inferior lateral aspect of the right thigh consistent  with corneal abrasion.  No more concerning features appreciated.  Acuity intact.  Will start him on antibiotic ointment and refer to ophthalmology.  Discussed return precautions and management at home.      FINAL CLINICAL IMPRESSION(S) / ED DIAGNOSES   Final diagnoses:  Abrasion of right cornea, initial encounter     Rx / DC Orders   ED Discharge Orders          Ordered    erythromycin ophthalmic ointment  4 times daily        06/13/22 0516             Note:  This document was prepared using Dragon voice recognition software and may include unintentional dictation errors.   Delton Prairie, MD 06/13/22 [phone]

## 2022-06-13 NOTE — ED Triage Notes (Addendum)
Patient C/O right eye irritation, pain, and swelling that began around 1600 yesterday evening. He states that it feels better when he closes his eye, but the pain woke him out of his sleep. He does not wear contacts, and denies any injury to the eye. He states that he can see fine out of it. Of note, patient's left eye is also red and irritated, but he is not having any symptoms in that one. He did try to flush out the eye with water, but got no relief. Patient took two ibuprofen about an hour ago for pain

## 2022-10-19 IMAGING — CR DG ABDOMEN 2V
3 series · 3 of 3 positions shown · non-contrast
Comparison: 09/23/2014

CLINICAL DATA: Abdominal pain

EXAM:
ABDOMEN - 2 VIEW

[abdomen erect (1 of 2)]
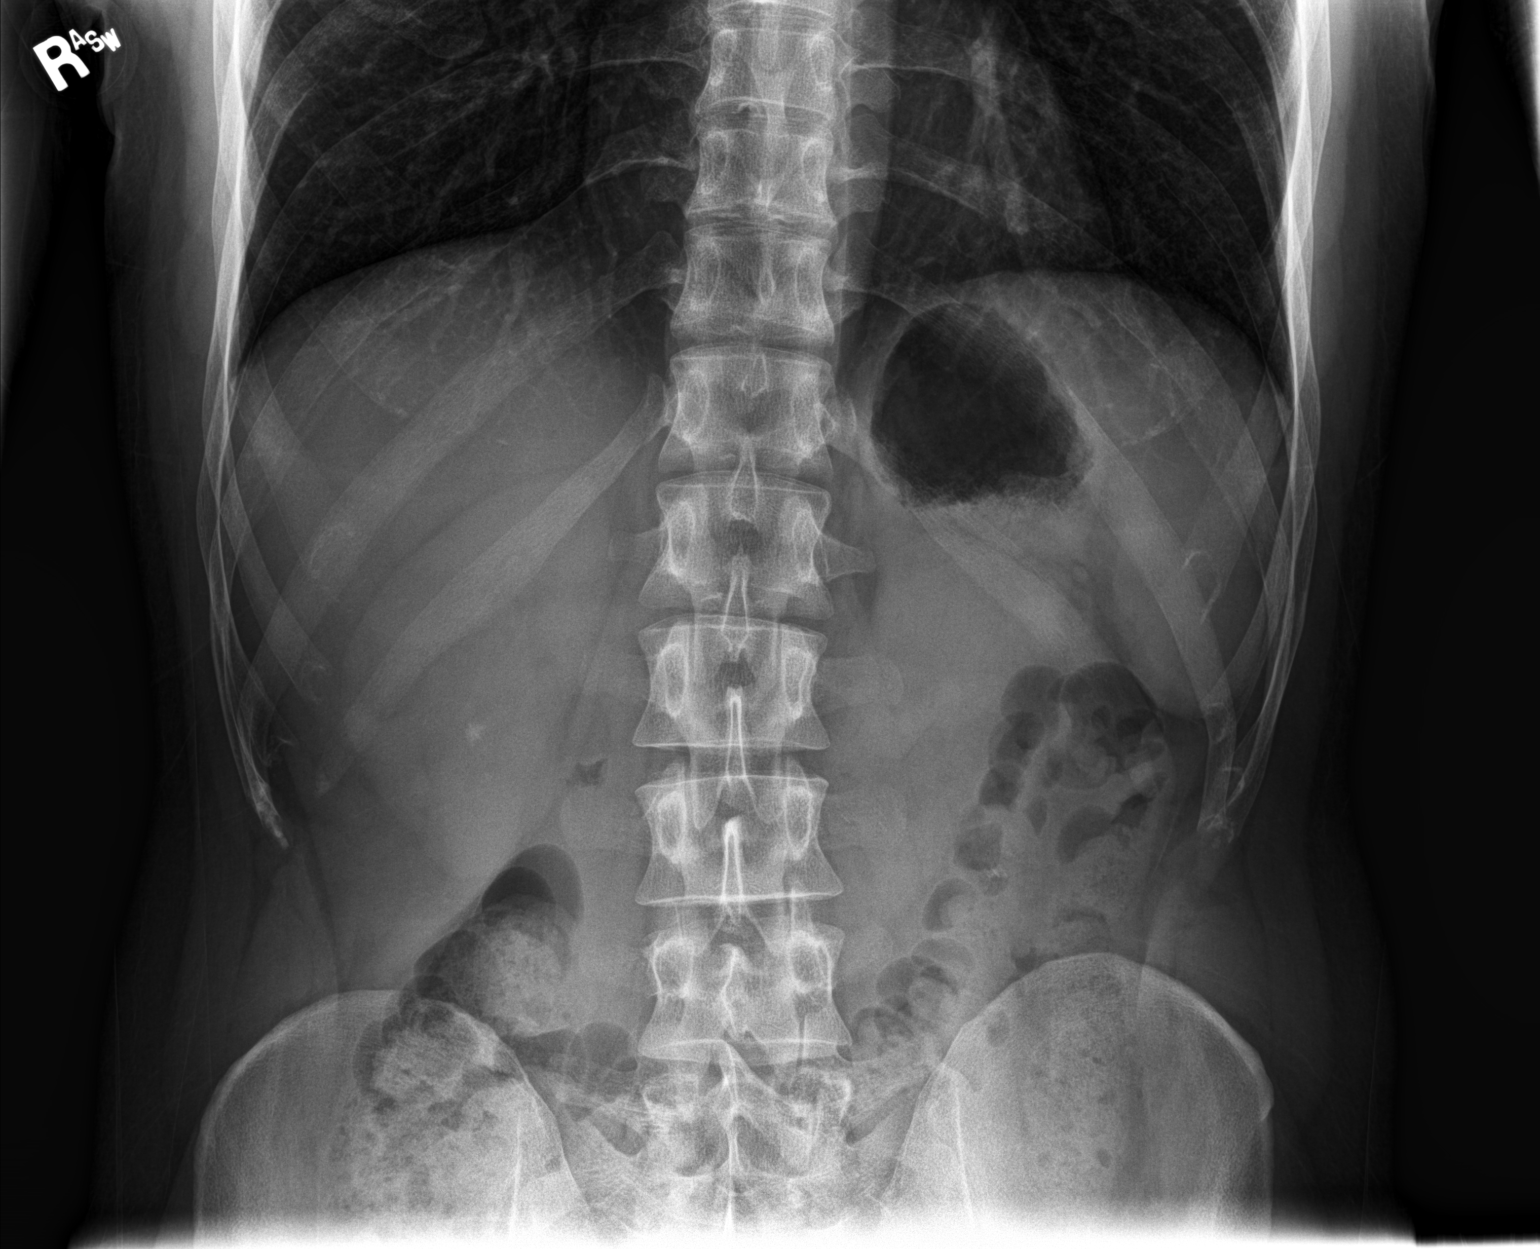

[abdomen erect (2 of 2)]
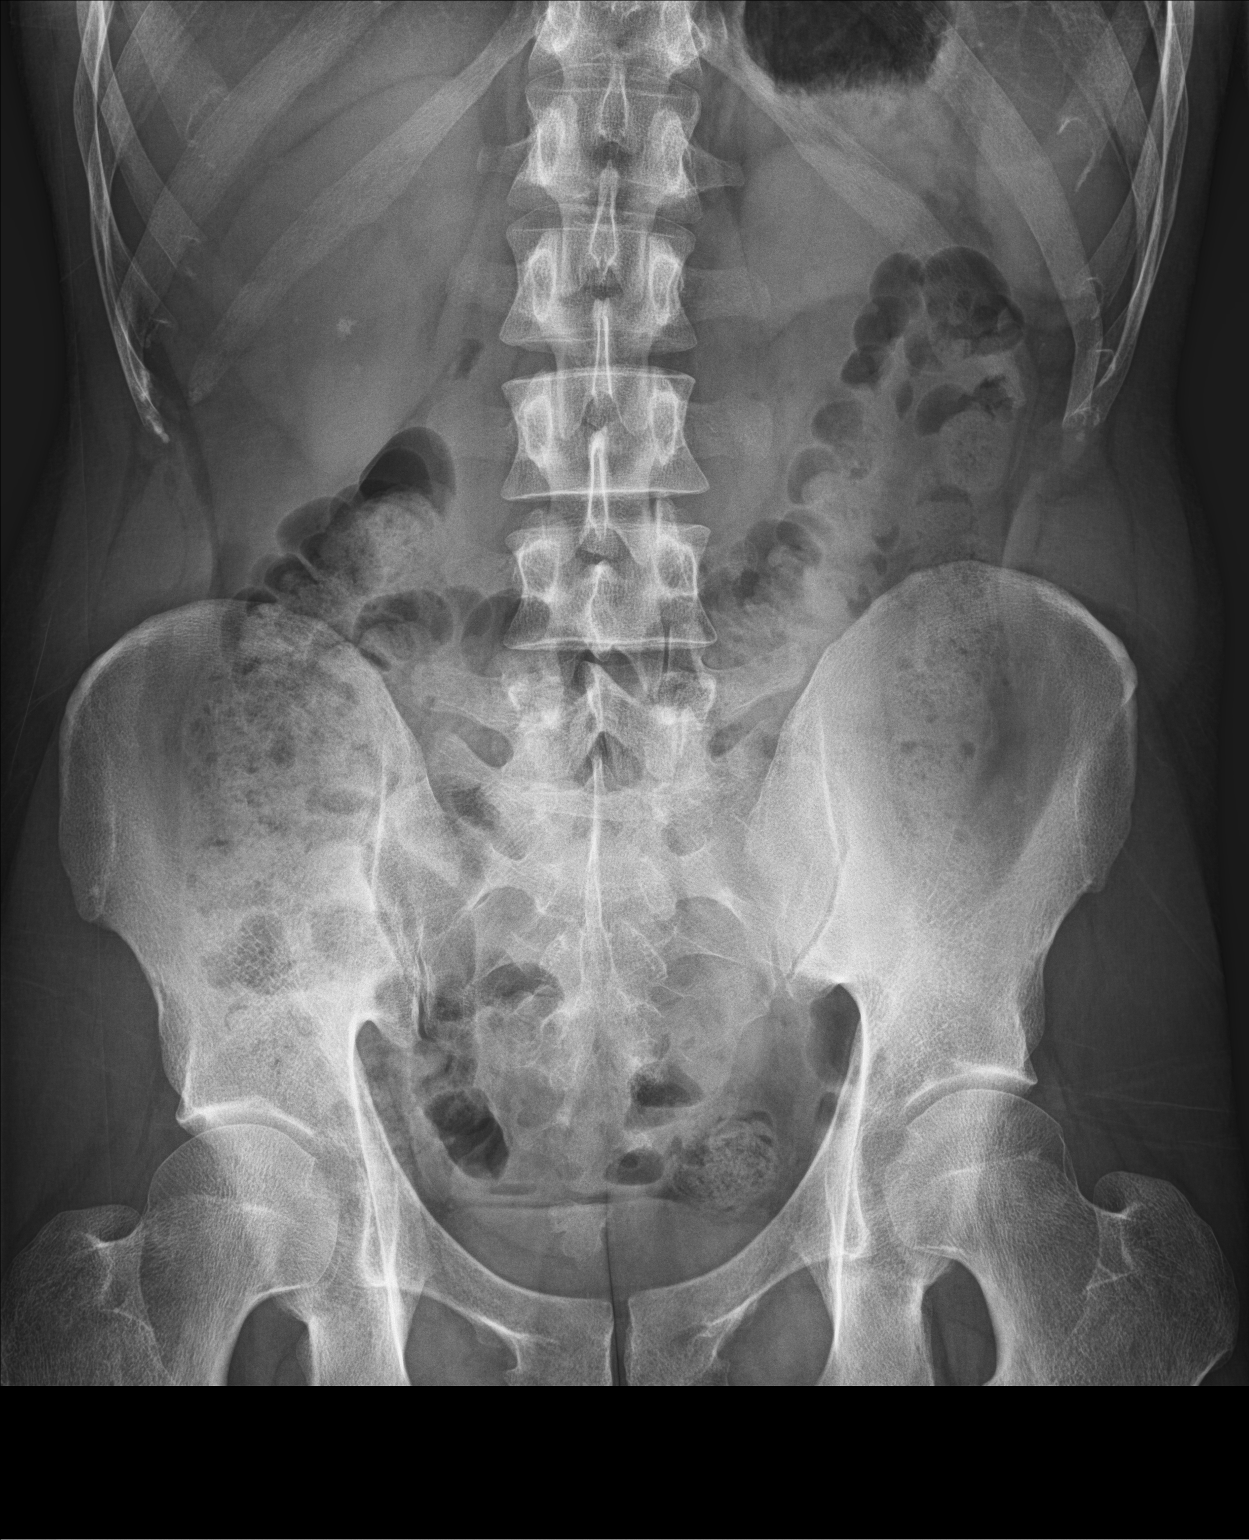

[abdomen supine]
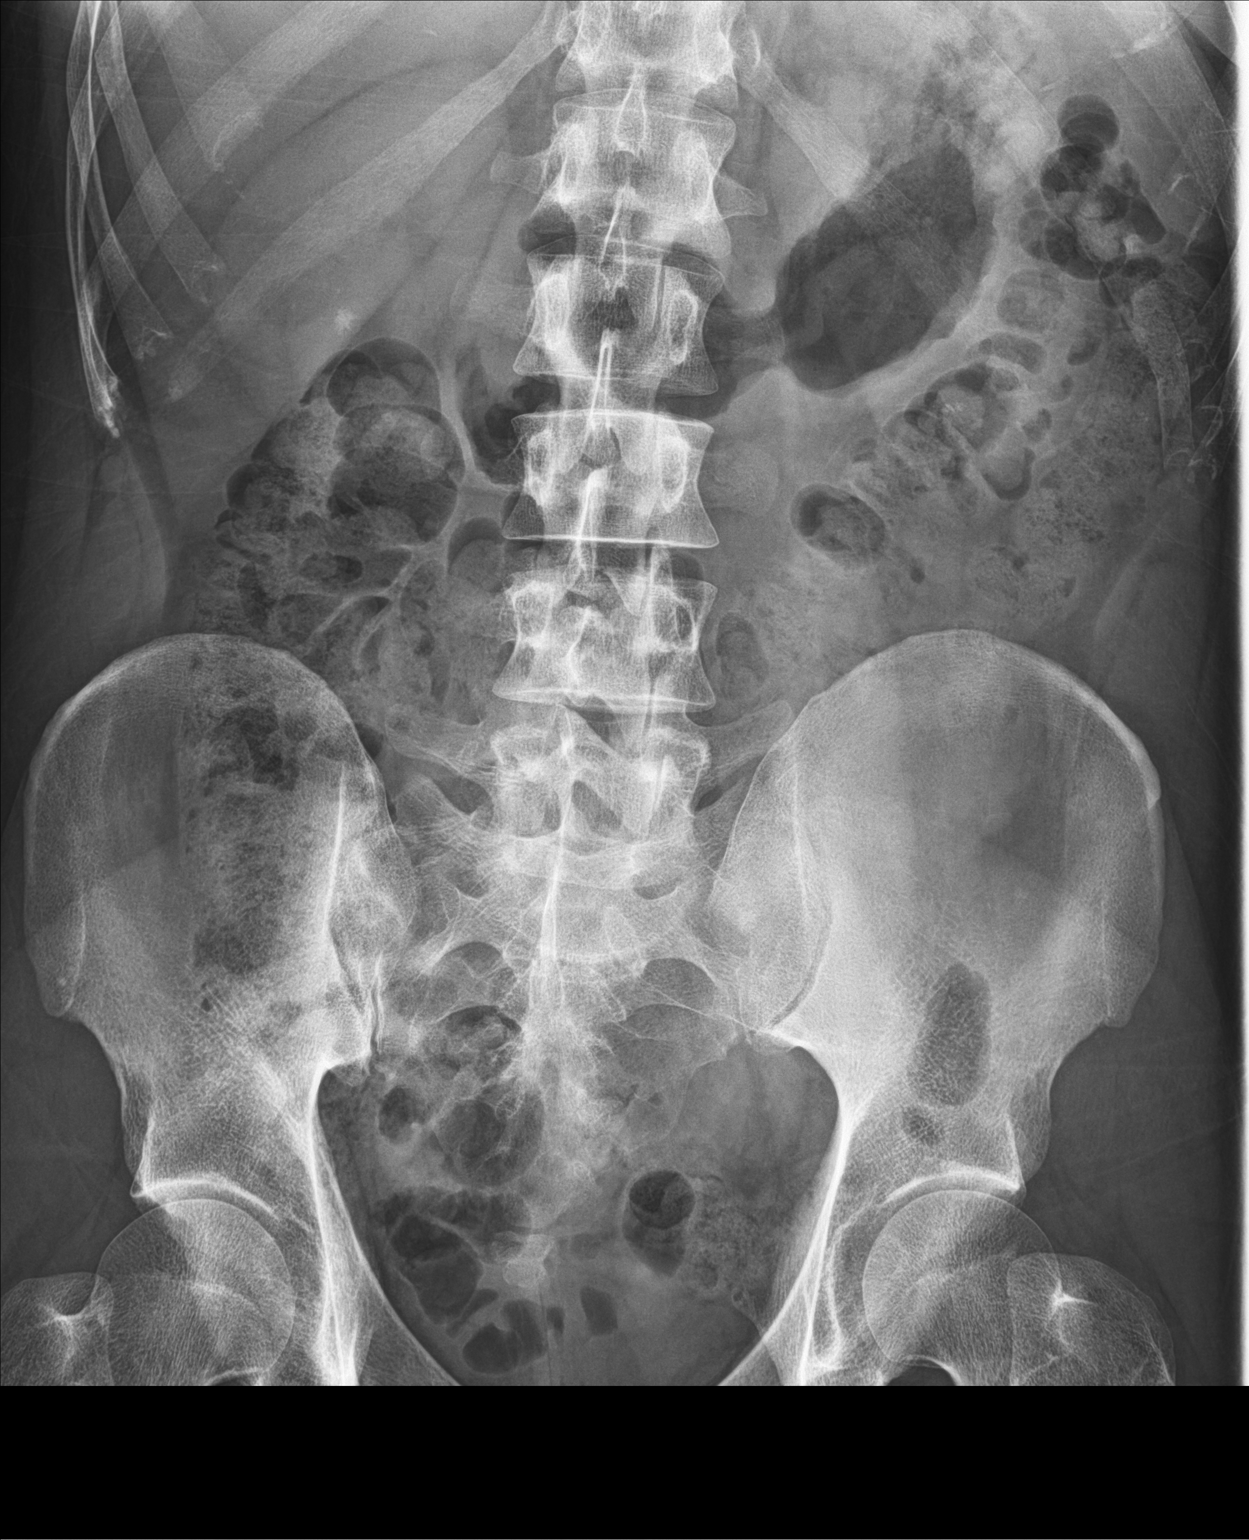

[3 of 3 positions shown; findings below may reference images not displayed]

FINDINGS: Scattered large and small bowel gas is noted. Mild retained fecal
material is noted consistent with a degree of constipation. No free
air is noted. There is a 5-6 mm calcification over the lower pole of
the right kidney consistent with nonobstructing renal stone. There
is a questionable calcification over the left sacrum which could
represent a left ureteral stone given the patient's clinical
history. No bony abnormality is noted.
IMPRESSION: Nonobstructing right renal stone.

Questionable calcification over the sacrum on the left which could
represent a left ureteral stone given the patient's clinical
history. CT urography would be helpful for further evaluation.

## 2022-10-19 IMAGING — CT CT RENAL STONE PROTOCOL
2 of 4 series · 16 of 46 positions shown, 18 images · non-contrast
Comparison: None.

CLINICAL DATA: LLQ pain

EXAM:
CT ABDOMEN AND PELVIS WITHOUT CONTRAST
TECHNIQUE: Multidetector CT imaging of the abdomen and pelvis was performed
following the standard protocol without IV contrast.

[Series 2: stone full standard · axial · 0.72mm/px · z∈[-704,-274]mm · 13 of 96 slices shown, 15 images]
[im 5/96  soft-tissue]
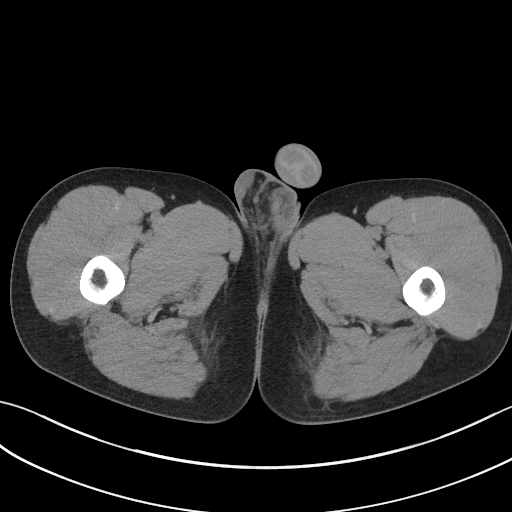
[im 5/96  bone]
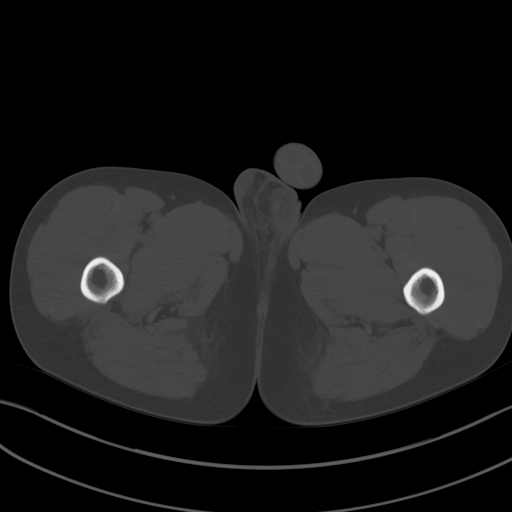
[im 13/96  soft-tissue]
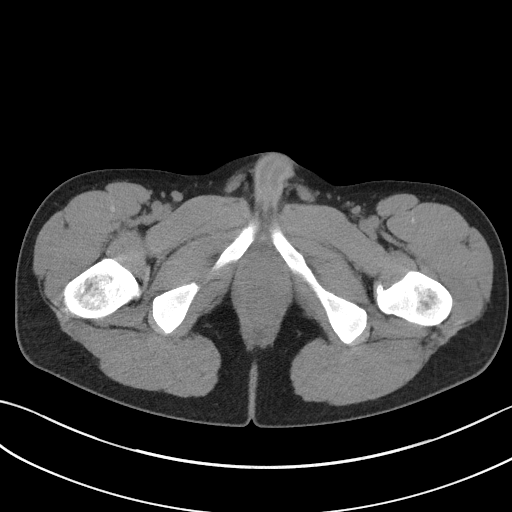
[im 21/96  soft-tissue]
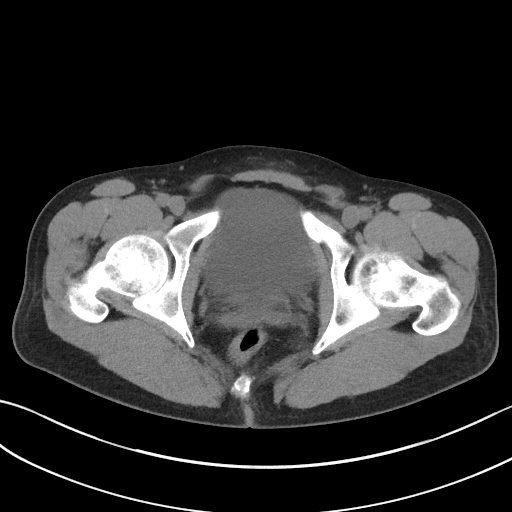
[im 25/96  soft-tissue]
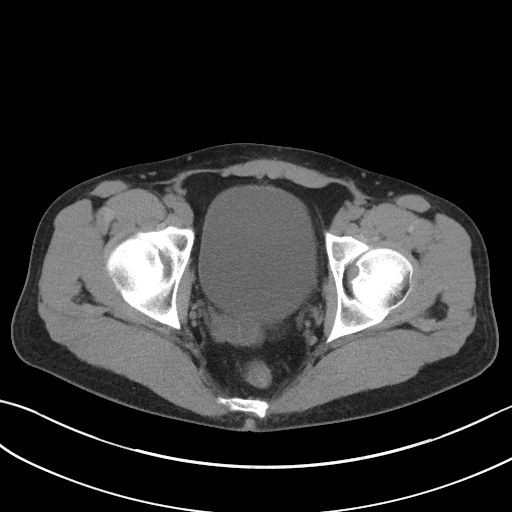
[im 34/96  soft-tissue]
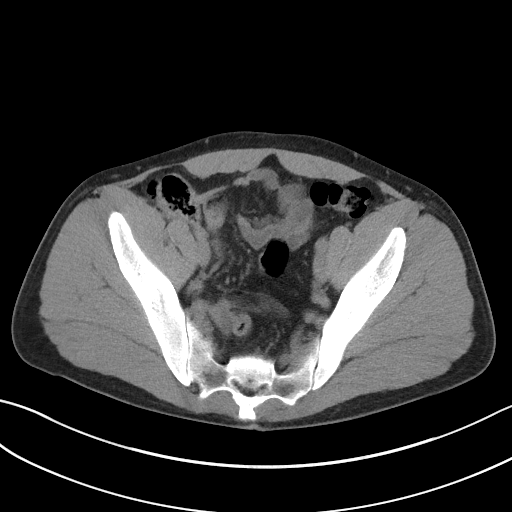
[im 42/96  soft-tissue]
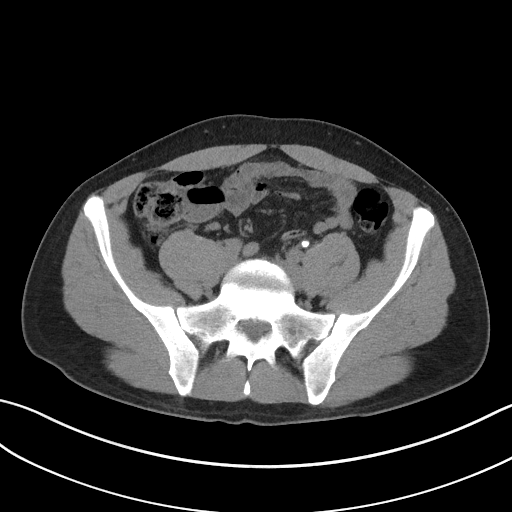
[im 50/96  soft-tissue]
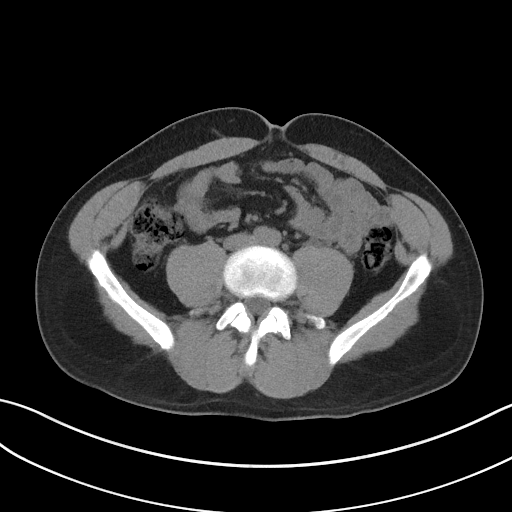
[im 54/96  soft-tissue]
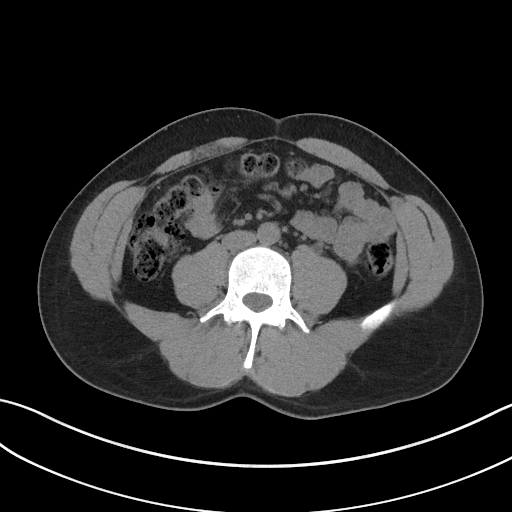
[im 62/96  soft-tissue]
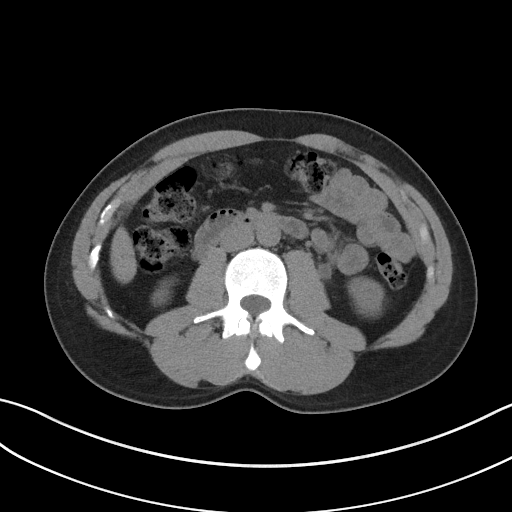
[im 62/96  bone]
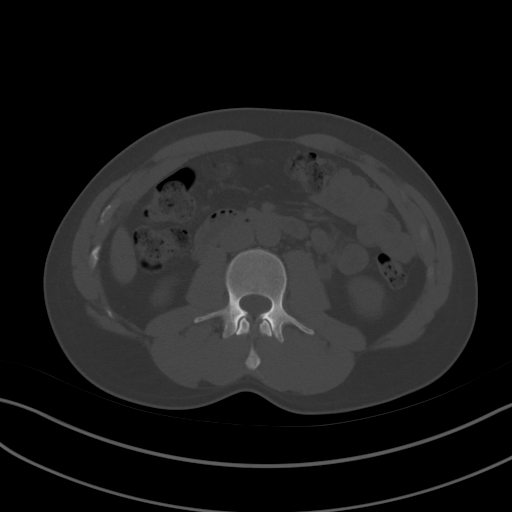
[im 71/96  soft-tissue]
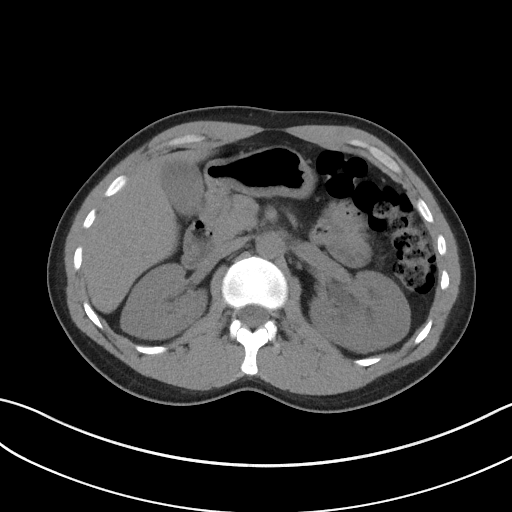
[im 75/96  soft-tissue]
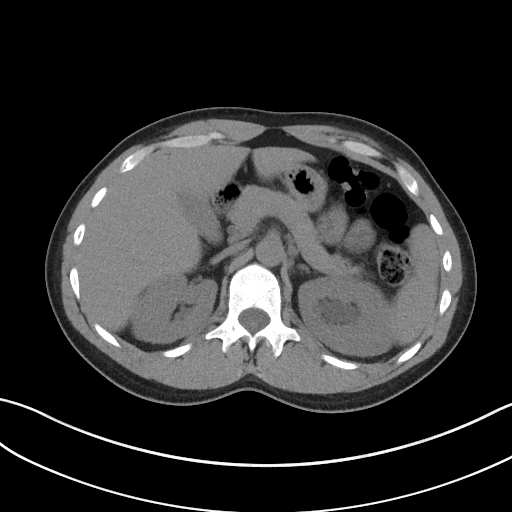
[im 83/96  soft-tissue]
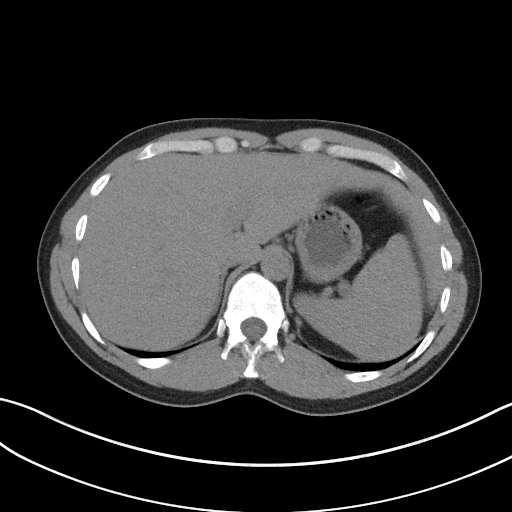
[im 91/96  soft-tissue]
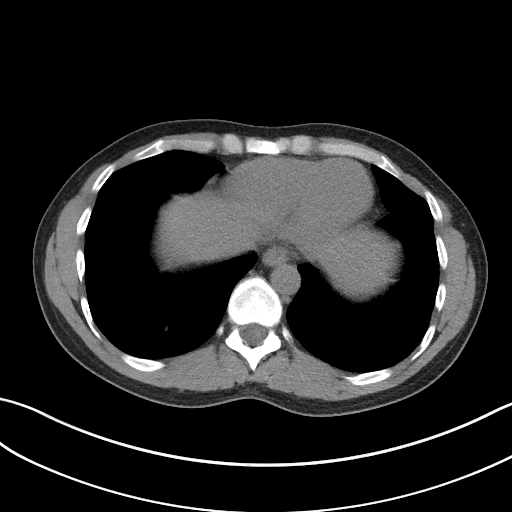

[Series 5: coronal · coronal · 0.66mm/px · 3 of 120 slices shown]
[im 40/120  soft-tissue]
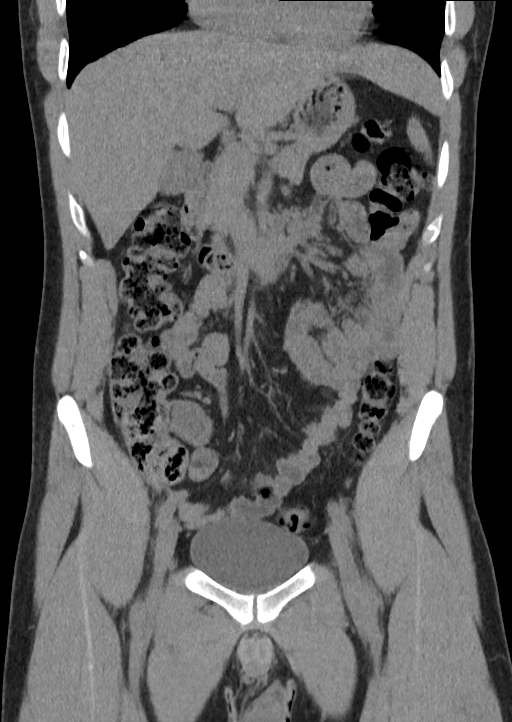
[im 53/120  soft-tissue]
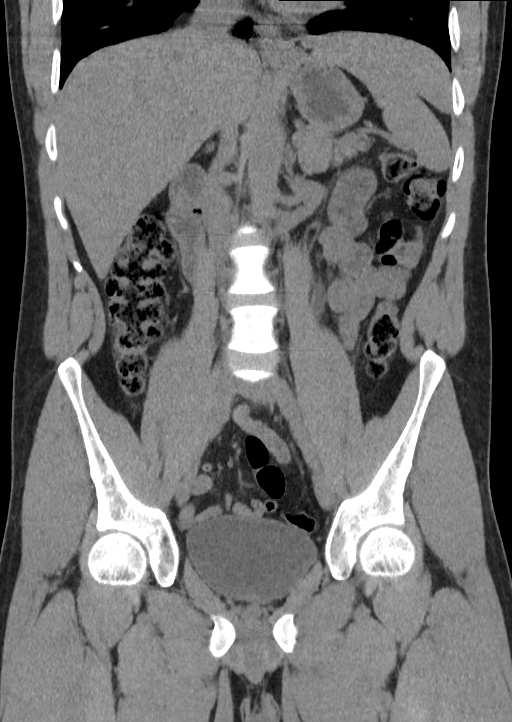
[im 67/120  soft-tissue]
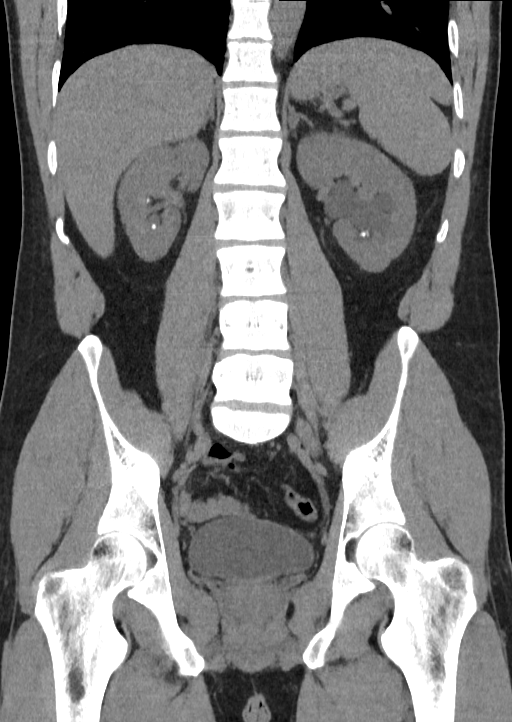

[16 of 46 positions shown; findings below may reference images not displayed]

FINDINGS: Lower chest: No acute abnormality.

Hepatobiliary: No focal liver abnormality is seen. No gallstones,
gallbladder wall thickening, or biliary dilatation.

Pancreas: Unremarkable. No pancreatic ductal dilatation or
surrounding inflammatory changes.

Spleen: Normal in size without focal abnormality.

Adrenals/Urinary Tract: Adrenal glands are normal. Multiple
intrarenal stones measuring up to 6 mm midpole right and 5 mm left
lower pole. Mild to moderate left hydronephrosis and hydroureter
secondary to a 7 mm stone in the mid to distal left ureter at the L5
level. Bladder is normal.

Stomach/Bowel: Stomach is within normal limits. Appendix appears
normal. No evidence of bowel wall thickening, distention, or
inflammatory changes.

Vascular/Lymphatic: No significant vascular findings are present. No
enlarged abdominal or pelvic lymph nodes.

Reproductive: Prostate is unremarkable.

Other: No abdominal wall hernia or abnormality. No abdominopelvic
ascites.

Musculoskeletal: No acute or significant osseous findings.
IMPRESSION: 1. Mild to moderate left hydronephrosis and hydroureter secondary to
a 7 mm stone in the mid to distal left ureter at the L5 level.
2. Multiple intrarenal stones.

## 2023-04-02 ENCOUNTER — Encounter: Payer: Self-pay | Admitting: Emergency Medicine

## 2023-04-02 ENCOUNTER — Ambulatory Visit
Admission: EM | Admit: 2023-04-02 | Discharge: 2023-04-02 | Disposition: A | Discharge status: Home / Self Care | Payer: 59 | Attending: Emergency Medicine | Admitting: Emergency Medicine

## 2023-04-02 DIAGNOSIS — J0100 Acute maxillary sinusitis, unspecified: Secondary | ICD-10-CM | POA: Diagnosis not present

## 2023-04-02 MED ORDER — DOXYCYCLINE HYCLATE 100 MG PO CAPS: 100.0000 mg | ORAL_CAPSULE | Freq: Two times a day (BID) | ORAL | 0 refills | Status: AC

## 2023-04-02 MED ORDER — IPRATROPIUM BROMIDE 0.06 % NA SOLN: 2.0000 | Freq: Four times a day (QID) | NASAL | 12 refills | Status: AC

## 2023-04-02 NOTE — ED Triage Notes (Signed)
 Patient reports sinus congestion and pressure that started a week ago.  Patient reports he is now having right sided jaw pain.  Patient denies recent fevers.

## 2023-04-02 NOTE — Discharge Instructions (Addendum)
 The doxycycline  twice daily with food for 10 days for treatment of your sinusitis.  Use the Atrovent  nasal spray, 2 squirts up each nostril every 6 hours, as needed for nasal congestion.  Perform sinus irrigation 2-3 times a day with a [employer]Med sinus rinse kit and distilled water.  Do not use tap water.  You can use plain over-the-counter Mucinex every 6 hours to break up the stickiness of the mucus so your body can clear it.  Increase your oral fluid intake to thin out your mucus so that is also able for your body to clear more easily.  Take an over-the-counter probiotic, such as Culturelle-align-activia, 1 hour after each dose of antibiotic to prevent diarrhea.  If you develop any new or worsening symptoms return for reevaluation or see your primary care provider.

## 2023-04-02 NOTE — ED Provider Notes (Signed)
 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   HPI  33 year old male with past medical history significant for kidney stones presents for evaluation of right-sided maxillary sinus pain and pressure that started a week ago.  This has not been associated with any nasal discharge.  The patient reports that he did have fever on 1 day last week.  He also endorses ear pain but denies any sore throat or lower respiratory symptoms.  Past Medical History:  Diagnosis Date   Renal stone     There are no active problems to display for this patient.   Past Surgical History:  Procedure Laterality Date   NO PAST SURGERIES         Home Medications    Prior to Admission medications   Medication Sig Start Date End Date Taking? Authorizing Provider  doxycycline  (VIBRAMYCIN ) 100 MG capsule Take 1 capsule (100 mg total) by mouth 2 (two) times daily for 10 days. 04/02/23 04/12/23 Yes Bernardino Ditch, NP  ipratropium (ATROVENT ) 0.06 % nasal spray Place 2 sprays into both nostrils 4 (four) times daily. 04/02/23  Yes Bernardino Ditch, NP    Family History Family History  Problem Relation Age of Onset   Heart attack Mother    Diabetes Father     Social History Social History   Tobacco Use   Smoking status: Former    Current packs/day: 0.00    Types: Cigarettes    Quit date: 11/19/2016    Years since quitting: 6.3   Smokeless tobacco: Never  Vaping Use   Vaping status: Never Used  Substance Use Topics   Alcohol use: Yes    Comment: rare   Drug use: No     Allergies   Patient has no known allergies.   Review of Systems Review of Systems  Constitutional:  Positive for fever.  HENT:  Positive for congestion, ear pain, sinus pressure and sinus pain. Negative for rhinorrhea and sore throat.   Respiratory:   Negative for cough.      Physical Exam Triage Vital Signs ED Triage Vitals  Encounter Vitals Group     BP      Systolic BP Percentile      Diastolic BP Percentile      Pulse      Resp      Temp      Temp src      SpO2      Weight      Height      Head Circumference      Peak Flow      Pain Score      Pain Loc      Pain Education      Exclude from Growth Chart    No data found.  Updated Vital Signs BP 128/81 (BP Location: Right Arm)   Pulse (!) 56   Temp 98.5 F (36.9 C) (Oral)   Resp 15   Ht 5' 10 (1.778 m)   Wt 160 lb (72.6 kg)   SpO2 95%   BMI 22.96 kg/m   Visual Acuity Right Eye Distance:   Left Eye Distance:   Bilateral Distance:    Right Eye Near:   Left Eye Near:    Bilateral Near:  Physical Exam Vitals and nursing note reviewed.  Constitutional:      Appearance: Normal appearance. He is not ill-appearing.  HENT:     Head: Normocephalic and atraumatic.     Right Ear: Tympanic membrane, ear canal and external ear normal. There is no impacted cerumen.     Left Ear: Tympanic membrane, ear canal and external ear normal. There is no impacted cerumen.     Nose: Congestion and rhinorrhea present.     Comments: This mucosa is erythematous and edematous with yellow discharge in both nares.  Bilateral maxillary sinuses are tender to compression.  Frontals are benign.    Mouth/Throat:     Mouth: Mucous membranes are moist.     Pharynx: Oropharynx is clear. No oropharyngeal exudate or posterior oropharyngeal erythema.  Skin:    General: Skin is warm and dry.     Capillary Refill: Capillary refill takes less than 2 seconds.     Findings: No rash.  Neurological:     General: No focal deficit present.     Mental Status: He is alert and oriented to person, place, and time.      UC Treatments / Results  Labs (all labs ordered are listed, but only abnormal results are displayed) Labs Reviewed - No data to display  EKG   Radiology No results  found.  Procedures Procedures (including critical care time)  Medications Ordered in UC Medications - No data to display  Initial Impression / Assessment and Plan / UC Course  I have reviewed the triage vital signs and the nursing notes.  Pertinent labs & imaging results that were available during my care of the patient were reviewed by me and considered in my medical decision making (see chart for details).   Patient is a nontoxic-appearing 33 year old male presenting for evaluation of upper respiratory symptoms as outlined HPI above.  His physical exam does reveal inflammation of his nasal mucosa with yellow nasal discharge and tenderness to bilateral maxillary sinuses.  The remainder of his upper respiratory tract is benign.  He denies lower respiratory symptoms.  Given patient's cluster of symptoms, coupled with duration of symptoms, I do feel a trial of antibiotics is warranted and I will treat him for maxillary sinusitis with doxycycline  100 mg twice daily for 10 days.  He does have a hive allergy to amoxicillin  and he is unsure if he is ever taken cephalosporins before.  I will also prescribe Atrovent  nasal spray to help with the nasal congestion.  We discussed doing sinus irrigation along with using Mucinex to break up how thick his mucus is.  Return precautions reviewed.   Final Clinical Impressions(s) / UC Diagnoses   Final diagnoses:  Acute non-recurrent maxillary sinusitis     Discharge Instructions      The doxycycline  twice daily with food for 10 days for treatment of your sinusitis.  Use the Atrovent  nasal spray, 2 squirts up each nostril every 6 hours, as needed for nasal congestion.  Perform sinus irrigation 2-3 times a day with a [employer] sinus rinse kit and distilled water.  Do not use tap water.  You can use plain over-the-counter Mucinex every 6 hours to break up the stickiness of the mucus so your body can clear it.  Increase your oral fluid intake to thin out  your mucus so that is also able for your body to clear more easily.  Take an over-the-counter probiotic, such as Culturelle-align-activia, 1 hour after each dose of antibiotic to prevent diarrhea.  If you develop any new or worsening symptoms return for reevaluation or see your primary care provider.      ED Prescriptions     Medication Sig Dispense Auth. Provider   doxycycline  (VIBRAMYCIN ) 100 MG capsule Take 1 capsule (100 mg total) by mouth 2 (two) times daily for 10 days. 20 capsule Bernardino Ditch, NP   ipratropium (ATROVENT ) 0.06 % nasal spray Place 2 sprays into both nostrils 4 (four) times daily. 15 mL Bernardino Ditch, NP      PDMP not reviewed this encounter.   Bernardino Ditch, NP 04/02/23 1158
